# Patient Record
Sex: Female | Born: 1985 | Race: White | Hispanic: No | Marital: Married | Smoking: Current every day smoker
Health system: Southern US, Community
[De-identification: ages and names within clinical notes are randomized; demographics above are authoritative.]

## PROBLEM LIST (undated history)

## (undated) DIAGNOSIS — F32A Depression, unspecified: Secondary | ICD-10-CM

## (undated) DIAGNOSIS — D219 Benign neoplasm of connective and other soft tissue, unspecified: Secondary | ICD-10-CM

## (undated) DIAGNOSIS — K219 Gastro-esophageal reflux disease without esophagitis: Secondary | ICD-10-CM

## (undated) DIAGNOSIS — N809 Endometriosis, unspecified: Secondary | ICD-10-CM

## (undated) DIAGNOSIS — F419 Anxiety disorder, unspecified: Secondary | ICD-10-CM

## (undated) HISTORY — DX: Anxiety disorder, unspecified: F41.9

## (undated) HISTORY — DX: Gastro-esophageal reflux disease without esophagitis: K21.9

## (undated) HISTORY — DX: Depression, unspecified: F32.A

## (undated) HISTORY — PX: TUBAL LIGATION: SHX77

## (undated) HISTORY — DX: Benign neoplasm of connective and other soft tissue, unspecified: D21.9

---

## 2013-12-15 ENCOUNTER — Emergency Department: Payer: Self-pay | Admitting: Emergency Medicine

## 2014-05-30 ENCOUNTER — Emergency Department: Payer: Self-pay | Admitting: Student

## 2014-07-30 ENCOUNTER — Ambulatory Visit: Payer: Self-pay | Admitting: Obstetrics and Gynecology

## 2014-08-03 ENCOUNTER — Ambulatory Visit: Payer: Self-pay | Admitting: Obstetrics and Gynecology

## 2014-10-05 LAB — SURGICAL PATHOLOGY

## 2014-10-11 NOTE — Op Note (Signed)
PATIENT NAME:  Brittany Nicholson, Brittany Nicholson MR#:  [id number] DATE OF BIRTH:  May 16, 1986  DATE OF PROCEDURE:  08/03/2014  PREOPERATIVE DIAGNOSES: 1.  Multiparity, desiring permanent sterilization.  2.  Mirena intrauterine device in place, expired.   POSTOPERATIVE DIAGNOSES:  1.  Multiparity, desiring permanent sterilization.  2.  Mirena intrauterine device in place, expired.  3.  Suspected endometriosis.   PROCEDURE: Laparoscopic bilateral salpingectomy and excision and fulguration of endometriosis.   SURGEON: Hildred Laser, MD   ASSISTANT: Sharon Seller, MD   ANESTHESIA: General.   ESTIMATED BLOOD LOSS: Minimal.   OPERATIVE FLUIDS: 800 mL.   URINE OUTPUT: 300 mL.   COMPLICATIONS: None.   FINDINGS: On exam under anesthesia, the uterus was sounded to approximately 9 cm. IUD strings were visible from the cervical os. On laparoscopy there was normal-appearing uterus and bilateral tubes and ovaries. There were adhesions of the bowel to the abdominal wall and left pelvic sidewall. There were several powder burn and white lesions noted on the right ovarian fossa and posterior cul-de-sac.  SPECIMENS: Right and left fallopian tubes, also cul-de-sac and right ovarian fossa biopsies.  CONDITION: Stable.   DESCRIPTION OF PROCEDURE: The patient was taken to the operating room where she was placed under general anesthesia without difficulty. She was then prepped and draped in normal sterile fashion and placed in the dorsal lithotomy position in Fountain stirrups. She was then prepped and draped in normal sterile fashion. After this a straight catheterization was performed with return of 300 mL of clear urine. Next, a sterile speculum was placed inside the patient's vagina. The cervix was identified. Ring forceps were then used to grasp the strings of the IUD. The IUD was removed without difficulty. Next, the uterus was sounded to approximately 9 cm and a Hulka clamp was then placed for uterine  manipulation.  Attention was then turned to the abdomen where a 5 mm infraumbilical incision was made. Next, the 5 mm trocar and sheath were inserted into the patient's abdomen under direct visualization with the laparoscope. Once entrance into the abdominal cavity had been confirmed, the abdomen was insufflated with CO2 gas. A survey of the abdomen was performed with the above findings noted. The upper abdomen was surveyed and it appeared to be normal. Next, two 5 mm trocar and sheath were then inserted on the lateral aspects of the abdomen careful to avoid any vasculature under direct visualization. Next, the left fallopian tube was grasped and elevated. The Harmonic device was then inserted through one of the 5 mm trocars and the device was then used to transect the mesosalpinx inferior to the fallopian tube and then to transect the fallopian tube near the cornual region. This fallopian tube was then removed through the trocar site and sent to pathology. Attention was then turned to the right fallopian tube where in a similar fashion the fallopian tube was then transected using the Harmonic device and removed through the 5 mm trocar site. This was also sent to pathology for evaluation. The transected areas appeared to be hemostatic.   Next, attention was then turned to the areas of suspected endometriosis implants. These areas were then removed using a biopsy forceps, of the right ovarian fossa and the posterior cul-de-sac. The areas were then cauterized using the Kleppinger device. Again, hemostasis was noted throughout. Estimated blood loss was minimal. The patient tolerated the procedure well. The trocars were then removed from the abdomen after the CO2 gas was allowed to be expressed. Trocar sites remained hemostatic. The  incisions were approximated using LiquiBand. The Hulka clamp was then removed from the patient's vagina.  All sponge and instrument counts were correct x2 at the end of the procedure.  The patient was awakened and taken to the PACU in stable condition.   ____________________________ Jacques EarthlyAnika S. Valentino Saxonherry, MD asc:sb D: 08/04/2014 08:42:58 ET T: 08/04/2014 11:32:31 ET JOB#: [id number]  cc: Jacques EarthlyAnika S. Valentino Saxonherry, MD, <Dictator> Fabian NovemberANIKA S Zeph Riebel MD ELECTRONICALLY SIGNED 08/07/2014 14:52

## 2015-03-03 ENCOUNTER — Encounter: Payer: Self-pay | Admitting: Emergency Medicine

## 2015-03-03 ENCOUNTER — Emergency Department: Payer: Medicaid Other

## 2015-03-03 ENCOUNTER — Emergency Department
Admission: EM | Admit: 2015-03-03 | Discharge: 2015-03-03 | Disposition: A | Discharge status: Home / Self Care | Payer: Medicaid Other | Attending: Emergency Medicine | Admitting: Emergency Medicine

## 2015-03-03 DIAGNOSIS — I880 Nonspecific mesenteric lymphadenitis: Secondary | ICD-10-CM | POA: Diagnosis not present

## 2015-03-03 DIAGNOSIS — Z720 Tobacco use: Secondary | ICD-10-CM | POA: Insufficient documentation

## 2015-03-03 DIAGNOSIS — Z3202 Encounter for pregnancy test, result negative: Secondary | ICD-10-CM | POA: Insufficient documentation

## 2015-03-03 DIAGNOSIS — R197 Diarrhea, unspecified: Secondary | ICD-10-CM

## 2015-03-03 DIAGNOSIS — K561 Intussusception: Secondary | ICD-10-CM

## 2015-03-03 DIAGNOSIS — R1011 Right upper quadrant pain: Secondary | ICD-10-CM | POA: Diagnosis present

## 2015-03-03 HISTORY — DX: Endometriosis, unspecified: N80.9

## 2015-03-03 LAB — COMPREHENSIVE METABOLIC PANEL
ALT: 23 U/L (ref 14–54)
AST: 23 U/L (ref 15–41)
Albumin: 4.4 g/dL (ref 3.5–5.0)
Alkaline Phosphatase: 64 U/L (ref 38–126)
Anion gap: 9 (ref 5–15)
BILIRUBIN TOTAL: 0.6 mg/dL (ref 0.3–1.2)
BUN: 11 mg/dL (ref 6–20)
CALCIUM: 9.8 mg/dL (ref 8.9–10.3)
CO2: 25 mmol/L (ref 22–32)
Chloride: 104 mmol/L (ref 101–111)
Creatinine, Ser: 0.59 mg/dL (ref 0.44–1.00)
GFR calc Af Amer: >60 mL/min (ref >60)
Glucose, Bld: 92 mg/dL (ref 65–99)
POTASSIUM: 4 mmol/L (ref 3.5–5.1)
Sodium: 138 mmol/L (ref 135–145)
TOTAL PROTEIN: 7.8 g/dL (ref 6.5–8.1)

## 2015-03-03 LAB — URINALYSIS COMPLETE WITH MICROSCOPIC (ARMC ONLY)
BILIRUBIN URINE: NEGATIVE
Glucose, UA: NEGATIVE mg/dL
HGB URINE DIPSTICK: NEGATIVE
KETONES UR: NEGATIVE mg/dL
LEUKOCYTES UA: NEGATIVE
Nitrite: NEGATIVE
PH: 6 (ref 5.0–8.0)
Protein, ur: NEGATIVE mg/dL
Specific Gravity, Urine: 1.024 (ref 1.005–1.030)

## 2015-03-03 LAB — CBC
HEMATOCRIT: 42.4 % (ref 35.0–47.0)
Hemoglobin: 14.6 g/dL (ref 12.0–16.0)
MCH: 31.1 pg (ref 26.0–34.0)
MCHC: 34.4 g/dL (ref 32.0–36.0)
MCV: 90.6 fL (ref 80.0–100.0)
Platelets: 243 10*3/uL (ref 150–440)
RBC: 4.69 MIL/uL (ref 3.80–5.20)
RDW: 12.8 % (ref 11.5–14.5)
WBC: 15.5 10*3/uL — AB (ref 3.6–11.0)

## 2015-03-03 LAB — LIPASE, BLOOD: Lipase: 36 U/L (ref 22–51)

## 2015-03-03 MED ORDER — FENTANYL CITRATE (PF) 100 MCG/2ML IJ SOLN
50.0000 ug | Freq: Once | INTRAMUSCULAR | Status: AC
  Administered 2015-03-03: 50 ug via INTRAVENOUS
  Filled 2015-03-03: qty 2

## 2015-03-03 MED ORDER — OXYCODONE-ACETAMINOPHEN 5-325 MG PO TABS: 1.0000 | ORAL_TABLET | Freq: Four times a day (QID) | ORAL | Status: AC | PRN

## 2015-03-03 MED ORDER — ONDANSETRON HCL 4 MG PO TABS: 4.0000 mg | ORAL_TABLET | Freq: Three times a day (TID) | ORAL | Status: AC | PRN

## 2015-03-03 MED ORDER — IOHEXOL 300 MG/ML  SOLN
100.0000 mL | Freq: Once | INTRAMUSCULAR | Status: AC | PRN
  Administered 2015-03-03: 100 mL via INTRAVENOUS

## 2015-03-03 MED ORDER — LOPERAMIDE HCL 2 MG PO TABS: 2.0000 mg | ORAL_TABLET | Freq: Four times a day (QID) | ORAL | Status: DC | PRN

## 2015-03-03 MED ORDER — ONDANSETRON HCL 4 MG/2ML IJ SOLN
4.0000 mg | Freq: Once | INTRAMUSCULAR | Status: AC
  Administered 2015-03-03: 4 mg via INTRAVENOUS
  Filled 2015-03-03: qty 2

## 2015-03-03 MED ORDER — IOHEXOL 240 MG/ML SOLN
25.0000 mL | Freq: Once | INTRAMUSCULAR | Status: AC | PRN
  Administered 2015-03-03: 25 mL via ORAL

## 2015-03-03 NOTE — ED Notes (Signed)
Patient to ER for c/o RUQ pain since Sunday morning. Patient states she had large steak dinner on Saturday night, woke up in the middle of the night with severe pain. Patient states pain is worse at night and after eating. Also reports intermittent diarrhea.

## 2015-03-03 NOTE — Consult Note (Signed)
Patient ID: Brittany Nicholson, female   DOB: 1985/10/08, 29 y.o.   MRN: [id number] CC: ABDOMINAL PAIN HPI Brittany Nicholson is a 29 y.o. female who presents to emergency department with a three-day history of abdominal pain. Patient states that during this time. She's had progressively worsening waves of right upper quadrant to the right lateral abdominal pain. During this time. She initially constipation prior to today where she had multiple bouts of loose watery stools. She denies any fevers, chills, nausea, vomiting however she has had a difficult to describe full sensation. Her only sick contacts have been her children who are both battling head colds. She is otherwise in her usual state of good health  HPI  Past Medical History  Diagnosis Date  . Endometriosis     Past Surgical History  Procedure Laterality Date  . Tubal ligation      No family history on file.  Social History Social History  Substance Use Topics  . Smoking status: Current Every Day Smoker  . Smokeless tobacco: None  . Alcohol Use: Yes    Allergies  Allergen Reactions  . Aspirin Swelling  . Ibuprofen Swelling  . Nsaids Swelling    No current facility-administered medications for this encounter.   Current Outpatient Prescriptions  Medication Sig Dispense Refill  . loperamide (LOPERAMIDE A-D) 2 MG tablet Take 1 tablet (2 mg total) by mouth 4 (four) times daily as needed for diarrhea or loose stools. 12 tablet 0  . ondansetron (ZOFRAN) 4 MG tablet Take 1 tablet (4 mg total) by mouth every 8 (eight) hours as needed for nausea or vomiting. 15 tablet 1  . oxyCODONE-acetaminophen (ROXICET) 5-325 MG per tablet Take 1 tablet by mouth every 6 (six) hours as needed for severe pain. 15 tablet 0     Review of Systems A multipoint review of systems was completed. All pertinent positives and negatives within the history of present illness.  Physical Exam Blood pressure 122/83, pulse 88, temperature 98.3 F (36.8 C),  temperature source Oral, resp. rate 16, height  (1.575 m), weight 89.812 kg (198 lb), last menstrual period 02/24/2015, SpO2 98 %. CONSTITUTIONAL: No acute distress. EYES: Pupils are equal, round, and reactive to light, Sclera are non-icteric. EARS, NOSE, MOUTH AND THROAT: The oropharynx is clear. The oral mucosa is pink and moist. Hearing is intact to voice. LYMPH NODES:  Lymph nodes in the neck are normal. RESPIRATORY:  Lungs are clear. There is normal respiratory effort, with equal breath sounds bilaterally, and without pathologic use of accessory muscles. CARDIOVASCULAR: Heart is regular without murmurs, gallops, or rubs. GI: The abdomen is  soft, minimal tenderness to deep palpation to RUQ, and nondistended. There are no palpable masses. There is no hepatosplenomegaly. There are normal bowel sounds in all quadrants. GU: Rectal deferred.   MUSCULOSKELETAL: Normal muscle strength and tone. No cyanosis or edema.   SKIN: Turgor is good and there are no pathologic skin lesions or ulcers. NEUROLOGIC: Motor and sensation is grossly normal. Cranial nerves are grossly intact. PSYCH:  Oriented to person, place and time. Affect is normal.  Data Reviewed I reviewed her labs and images. Her only lab abnormalities leukocytosis of 15,000. Right upper quadrant ultrasound was within normal limits her CT scan showed a short segment nonobstructing small bowel intussusception. He notices nonobstructing as contrast passes through it in his proximal and distal to the intussusception. There is also multiple lymph nodes in the right lower quadrant along the bowel mesentery. I have personally reviewed the  patient's imaging, laboratory findings and medical records.    Assessment    29 year old female with mesenteric adenitis.    Plan    Discussed with the patient that her signs and symptoms are most consistent with mesenteric adenitis. This is likely related to a viral gastroenteritis. This would correlate  with her children currently having a viral URI. Discussed that this is not a surgical disease. It is usually self-limiting and should resolve on its within 7-10 days. In terms of her intussusception that was visualized on CT scan. Discussed that should she develop any signs or symptoms of obstruction which would include nausea, vomiting, bowel/abdominal distention that she is to return to the emergency department. Otherwise she can continue as an outpatient and follow up with her PCM.     Time spent with the patient was 20 minutes, with more than 50% of the time spent in face-to-face education, counseling and care coordination.     Ricarda Frame 03/03/2015, 5:26 PM

## 2015-03-03 NOTE — ED Notes (Signed)
POC preg negative.

## 2015-03-03 NOTE — Discharge Instructions (Signed)
Abdominal Pain Many things can cause abdominal pain. Usually, abdominal pain is not caused by a disease and will improve without treatment. It can often be observed and treated at home. Your health care provider will do a physical exam and possibly order blood tests and X-rays to help determine the seriousness of your pain. However, in many cases, more time must pass before a clear cause of the pain can be found. Before that point, your health care provider may not know if you need more testing or further treatment. HOME CARE INSTRUCTIONS  Monitor your abdominal pain for any changes. The following actions may help to alleviate any discomfort you are experiencing:  Only take over-the-counter or prescription medicines as directed by your health care provider.  Do not take laxatives unless directed to do so by your health care provider.  Try a clear liquid diet (broth, tea, or water) as directed by your health care provider. Slowly move to a bland diet as tolerated. SEEK MEDICAL CARE IF:  You have unexplained abdominal pain.  You have abdominal pain associated with nausea or diarrhea.  You have pain when you urinate or have a bowel movement.  You experience abdominal pain that wakes you in the night.  You have abdominal pain that is worsened or improved by eating food.  You have abdominal pain that is worsened with eating fatty foods.  You have a fever. SEEK IMMEDIATE MEDICAL CARE IF:   Your pain does not go away within 2 hours.  You keep throwing up (vomiting).  Your pain is felt only in portions of the abdomen, such as the right side or the left lower portion of the abdomen.  You pass bloody or black tarry stools. MAKE SURE YOU:  Understand these instructions.   Will watch your condition.   Will get help right away if you are not doing well or get worse.  Document Released: 03/08/2005 Document Revised: 06/03/2013 Document Reviewed: 02/05/2013 [facility] Patient Information  2015 [place], [place]. This information is not intended to replace advice given to you by your health care provider. Make sure you discuss any questions you have with your health care provider.   Drink plenty of fluids to stay well hydrated. You may follow up BRAT diet to help with diarrhea.  You may take Tylenol or Motrin for mild to moderate pain, Percocet for severe pain. Do not drive within 8 hours of taking Percocet and do not care for your children alone when taking Percocet  Please return to the emergency department for severe pain, inability to keep down fluids, fever, or any other symptoms concerning to you.

## 2015-03-03 NOTE — ED Notes (Signed)
Patient transported to Ultrasound 

## 2015-03-03 NOTE — ED Provider Notes (Addendum)
-----------------------------------------   4:45 PM on 03/03/2015 -----------------------------------------  I have taken signout from Dr. Derrill Kay about this patient.29 y.o. nonpregnant female with right-sided abdominal pain to in the ED is afebrile, and has negative right upper quadrant ultrasound. She did have a white count of 15 and CT scan was ordered. The CT abdomen shows a small segment of small bowel intussusception without obstruction. I have talked to Dr. Tonita Cong from general surgery who will, and evaluate the patient. At this time the patient's pain is well-controlled.  ----------------------------------------- 5:16 PM on 03/03/2015 -----------------------------------------  Patient has been seen and evaluated by Dr. Tonita Cong. He feels that the etiology of her CT scan findings are likely viral gastroenteritis. We will plan to treat her for her diarrhea and pain and discharged home. She has been given return precautions and red flag symptoms to come back for. She understands the plan and will follow up with her primary care physician. Per Dr. Lovett Sox, antibiotics are not indicated at this time.  Rockne Menghini, MD 03/03/15 1646  Rockne Menghini, MD 03/03/15 [phone]

## 2015-03-03 NOTE — ED Provider Notes (Signed)
[facility] Emergency Department Toba Claudio Note    ____________________________________________  Time seen: 1400  I have reviewed the triage vital signs and the nursing notes.   HISTORY  Chief Complaint Abdominal Pain   History limited by: Not Limited   HPI Brittany Nicholson is a 29 y.o. female who presents to the emergency department today with concerns for right-sided and right upper quadrant abdominal pain. The patient states that these symptoms started 4 days ago after a large meal. She states that the pain has been constant and has gradually gotten worse. She states the pain is worse after eating. She has not had any vomiting. She has had some diarrhea but denies any bloody stool. Denies any fevers. Denies any chest pain or shortness breath.   Past Medical History  Diagnosis Date  . Endometriosis     There are no active problems to display for this patient.   Past Surgical History  Procedure Laterality Date  . Tubal ligation      No current outpatient prescriptions on file.  Allergies Aspirin; Ibuprofen; and Nsaids  No family history on file.  Social History Social History  Substance Use Topics  . Smoking status: Current Every Day Smoker  . Smokeless tobacco: None  . Alcohol Use: Yes    Review of Systems  Constitutional: Negative for fever. Cardiovascular: Negative for chest pain. Respiratory: Negative for shortness of breath. Gastrointestinal: Positive for abdominal pain Genitourinary: Negative for dysuria. Musculoskeletal: Negative for back pain. Skin: Negative for rash. Neurological: Negative for headaches, focal weakness or numbness.   10-point ROS otherwise negative.  ____________________________________________   PHYSICAL EXAM:  VITAL SIGNS: ED Triage Vitals  Enc Vitals Group     BP 03/03/15 1130 128/80 mmHg     Pulse Rate 03/03/15 1130 96     Resp 03/03/15 1130 18     Temp 03/03/15 1130 98.3 F (36.8 C)      Temp Source 03/03/15 1130 Oral     SpO2 03/03/15 1130 99 %     Weight 03/03/15 1130 198 lb (89.812 kg)     Height 03/03/15 1130  (1.575 m)     Head Cir --      Peak Flow --      Pain Score 03/03/15 1135 4   Constitutional: Alert and oriented. Well appearing and in no distress. Eyes: Conjunctivae are normal. PERRL. Normal extraocular movements. ENT   Head: Normocephalic and atraumatic.   Nose: No congestion/rhinnorhea.   Mouth/Throat: Mucous membranes are moist.   Neck: No stridor. Hematological/Lymphatic/Immunilogical: No cervical lymphadenopathy. Cardiovascular: Normal rate, regular rhythm.  No murmurs, rubs, or gallops. Respiratory: Normal respiratory effort without tachypnea nor retractions. Breath sounds are clear and equal bilaterally. No wheezes/rales/rhonchi. Gastrointestinal: Soft. Tender to palpation in the right side of her abdomen. No rebound. No guarding. No distention. There is no CVA tenderness. Genitourinary: Deferred Musculoskeletal: Normal range of motion in all extremities. No joint effusions.  No lower extremity tenderness nor edema. Neurologic:  Normal speech and language. No gross focal neurologic deficits are appreciated. Speech is normal.  Skin:  Skin is warm, dry and intact. No rash noted. Psychiatric: Mood and affect are normal. Speech and behavior are normal. Patient exhibits appropriate insight and judgment.  ____________________________________________    LABS (pertinent positives/negatives)  Labs Reviewed  CBC - Abnormal; Notable for the following:    WBC 15.5 (*)    All other components within normal limits  URINALYSIS COMPLETEWITH MICROSCOPIC ([facility] ONLY) - Abnormal; Notable for  the following:    Color, Urine YELLOW (*)    APPearance CLEAR (*)    Bacteria, UA RARE (*)    Squamous Epithelial / LPF 0-5 (*)    All other components within normal limits  LIPASE, BLOOD  COMPREHENSIVE METABOLIC PANEL  POC URINE PREG, ED      ____________________________________________   EKG  None  ____________________________________________    RADIOLOGY  Ultrasound right upper quadrant IMPRESSION: 5 mm adherent sludge ball or polyp. No other evidence for acute Cholecystitis.  CT abdomen and pelvis pending at time of sign out ____________________________________________   PROCEDURES  Procedure(s) performed: None  Critical Care performed: No  ____________________________________________   INITIAL IMPRESSION / ASSESSMENT AND PLAN / ED COURSE  Pertinent labs & imaging results that were available during my care of the patient were reviewed by me and considered in my medical decision making (see chart for details).  Patient presented to the emergency department today with right-sided and right upper quadrant abdominal pain. She did state is worse with eating. The initial thought was that this was likely gallstone. An ultrasound was performed, which did not show any signs of cholecystitis. Given that patient does have pain in the right side and does have an elevated white blood cell count and her serum I do think appendicitis now has to be ruled out. Will obtain a CT abdomen and pelvis.  ____________________________________________   FINAL CLINICAL IMPRESSION(S) / ED DIAGNOSES  Final diagnoses:  RUQ pain     Phineas Semen, MD 03/03/15 1600

## 2018-06-29 ENCOUNTER — Encounter: Payer: Self-pay | Admitting: Emergency Medicine

## 2018-06-29 ENCOUNTER — Emergency Department
Admission: EM | Admit: 2018-06-29 | Discharge: 2018-06-29 | Disposition: A | Discharge status: Home / Self Care | Payer: Managed Care, Other (non HMO) | Attending: Emergency Medicine | Admitting: Emergency Medicine

## 2018-06-29 ENCOUNTER — Other Ambulatory Visit: Payer: Self-pay

## 2018-06-29 DIAGNOSIS — F17200 Nicotine dependence, unspecified, uncomplicated: Secondary | ICD-10-CM | POA: Insufficient documentation

## 2018-06-29 DIAGNOSIS — R69 Illness, unspecified: Secondary | ICD-10-CM

## 2018-06-29 DIAGNOSIS — Z20828 Contact with and (suspected) exposure to other viral communicable diseases: Secondary | ICD-10-CM | POA: Diagnosis not present

## 2018-06-29 DIAGNOSIS — R05 Cough: Secondary | ICD-10-CM | POA: Diagnosis present

## 2018-06-29 DIAGNOSIS — J111 Influenza due to unidentified influenza virus with other respiratory manifestations: Secondary | ICD-10-CM | POA: Insufficient documentation

## 2018-06-29 MED ORDER — OSELTAMIVIR PHOSPHATE 75 MG PO CAPS: 75.0000 mg | ORAL_CAPSULE | Freq: Two times a day (BID) | ORAL | 0 refills | Status: AC

## 2018-06-29 MED ORDER — PROMETHAZINE-DM 6.25-15 MG/5ML PO SYRP: 5.0000 mL | ORAL_SOLUTION | Freq: Four times a day (QID) | ORAL | 0 refills | Status: DC | PRN

## 2018-06-29 NOTE — Discharge Instructions (Addendum)
Advised extra strength Tylenol for fever and body aches.

## 2018-06-29 NOTE — ED Provider Notes (Signed)
[facility] Emergency Department Provider Note   ____________________________________________   First MD Initiated Contact with Patient 06/29/18 1206     (approximate)  I have reviewed the triage vital signs and the nursing notes.   HISTORY  Chief Complaint Cough and Generalized Body Aches    HPI Brittany Nicholson is a 33 y.o. female patient presents with acute onset of body aches  ,cough, sore throat, and nausea and vomiting.  Patient states daughter was diagnosed with influenza A 4 days ago.  Patient rates her pain discomfort a 10/10.  Patient described the pain as "achy".  No palliative measure for complaint.  Past Medical History:  Diagnosis Date  . Endometriosis     Patient Active Problem List   Diagnosis Date Noted  . Mesenteric adenitis     Past Surgical History:  Procedure Laterality Date  . TUBAL LIGATION      Prior to Admission medications   Medication Sig Start Date End Date Taking? Authorizing Provider  loperamide (LOPERAMIDE A-D) 2 MG tablet Take 1 tablet (2 mg total) by mouth 4 (four) times daily as needed for diarrhea or loose stools. 03/03/15   Rockne Menghini, MD  oseltamivir (TAMIFLU) 75 MG capsule Take 1 capsule (75 mg total) by mouth 2 (two) times daily for 5 days. 06/29/18 07/04/18  Joni Reining, PA-C  promethazine-dextromethorphan (PROMETHAZINE-DM) 6.25-15 MG/5ML syrup Take 5 mLs by mouth 4 (four) times daily as needed for cough. 06/29/18   Joni Reining, PA-C    Allergies Aspirin; Ibuprofen; and Nsaids  No family history on file.  Social History Social History   Tobacco Use  . Smoking status: Current Every Day Smoker  Substance Use Topics  . Alcohol use: Yes  . Drug use: Not on file    Review of Systems  Constitutional: Chills and body aches. Eyes: No visual changes. ENT: Nasal congestion and sore throat. Cardiovascular: Denies chest pain. Respiratory: Denies shortness of breath.  Nonproductive  cough. Gastrointestinal: No abdominal pain.  No nausea, no vomiting.  No diarrhea.  No constipation. Genitourinary: Negative for dysuria. Musculoskeletal: Negative for back pain. Skin: Negative for rash. Neurological: Positive for headaches, but denies focal weakness or numbness. Allergic/Immunilogical: Aspirin and NSAIDs. ____________________________________________   PHYSICAL EXAM:  VITAL SIGNS: ED Triage Vitals  Enc Vitals Group     BP 06/29/18 1148 (!) 145/69     Pulse Rate 06/29/18 1148 99     Resp 06/29/18 1148 20     Temp 06/29/18 1148 98.5 F (36.9 C)     Temp Source 06/29/18 1148 Oral     SpO2 06/29/18 1148 98 %     Weight 06/29/18 1150 220 lb (99.8 kg)     Height 06/29/18 1150 5\' 2"  (1.575 m)     Head Circumference --      Peak Flow --      Pain Score 06/29/18 1150 10     Pain Loc --      Pain Edu? --      Excl. in GC? --    Constitutional: Alert and oriented. Well appearing and in no acute distress. Eyes: Conjunctivae are normal. PERRL. EOMI. Head: Atraumatic. Nose: Edematous nasal turbinates clear rhinorrhea. Mouth/Throat: Mucous membranes are moist.  Oropharynx non-erythematous.  Postnasal drainage. Neck: No stridor. Hematological/Lymphatic/Immunilogical: No cervical lymphadenopathy. Cardiovascular: Normal rate, regular rhythm. Grossly normal heart sounds.  Good peripheral circulation. Respiratory: Normal respiratory effort.  No retractions. Lungs CTAB. Gastrointestinal: Soft and nontender. No distention. No abdominal bruits.  No CVA tenderness. Neurologic:  Normal speech and language. No gross focal neurologic deficits are appreciated. No gait instability. Skin:  Skin is warm, dry and intact. No rash noted. Psychiatric: Mood and affect are normal. Speech and behavior are normal.  ____________________________________________   LABS (all labs ordered are listed, but only abnormal results are displayed)  Labs Reviewed - No data to  display ____________________________________________  EKG   ____________________________________________  RADIOLOGY  ED MD interpretation:    Official radiology report(s): No results found.  ____________________________________________   PROCEDURES  Procedure(s) performed: None  Procedures  Critical Care performed: No  ____________________________________________   INITIAL IMPRESSION / ASSESSMENT AND PLAN / ED COURSE  As part of my medical decision making, I reviewed the following data within the electronic MEDICAL RECORD NUMBER    Patient presents with cough and body aches status post exposure to flu by her daughter.  Patient given discharge care instruction and prescription for Tamiflu and Bromfed-DM.  Due to her allergies to NSAIDs advised exercise Tylenol for body aches and pains.  Patient given a work note.  Patient advised [facility]-up [facility] if no improvement in 3 to 5 days.  Return right ED if condition worsens.      ____________________________________________   FINAL CLINICAL IMPRESSION(S) / ED DIAGNOSES  Final diagnoses:  Influenza-like illness     ED Discharge Orders         Ordered    oseltamivir (TAMIFLU) 75 MG capsule  2 times daily     06/29/18 1212    promethazine-dextromethorphan (PROMETHAZINE-DM) 6.25-15 MG/5ML syrup  4 times daily PRN     06/29/18 1212           Note:  This document was prepared using Dragon voice recognition software and may include unintentional dictation errors.    Joni ReiningSmith, Ronald K, PA-C 06/29/18 1217    Schaevitz, Myra Rudeavid Matthew, MD 06/29/18 [phone]

## 2018-06-29 NOTE — ED Notes (Signed)
Pt verbalized understanding of discharge instructions. NAD at this time. 

## 2018-06-29 NOTE — ED Notes (Signed)
See triage note  Presents with body aches and subjective fever  Afebrile on arrival   Occasional cough   Daughter dx'd with flu last week

## 2018-06-29 NOTE — ED Triage Notes (Signed)
Cough and body aches since yesterday. Daughter tested positive for flu this week.

## 2019-06-24 ENCOUNTER — Encounter: Payer: Self-pay | Admitting: Emergency Medicine

## 2019-06-24 ENCOUNTER — Other Ambulatory Visit: Payer: Self-pay

## 2019-06-24 ENCOUNTER — Emergency Department
Admission: EM | Admit: 2019-06-24 | Discharge: 2019-06-24 | Disposition: A | Discharge status: Home / Self Care | Payer: Managed Care, Other (non HMO) | Attending: Emergency Medicine | Admitting: Emergency Medicine

## 2019-06-24 DIAGNOSIS — Z79899 Other long term (current) drug therapy: Secondary | ICD-10-CM | POA: Insufficient documentation

## 2019-06-24 DIAGNOSIS — R109 Unspecified abdominal pain: Secondary | ICD-10-CM | POA: Insufficient documentation

## 2019-06-24 DIAGNOSIS — F17210 Nicotine dependence, cigarettes, uncomplicated: Secondary | ICD-10-CM | POA: Insufficient documentation

## 2019-06-24 LAB — URINALYSIS, COMPLETE (UACMP) WITH MICROSCOPIC
Bilirubin Urine: NEGATIVE
Glucose, UA: NEGATIVE mg/dL
Ketones, ur: 20 mg/dL — AB
Leukocytes,Ua: NEGATIVE
Nitrite: NEGATIVE
Protein, ur: NEGATIVE mg/dL
Specific Gravity, Urine: 1.011 (ref 1.005–1.030)
pH: 6 (ref 5.0–8.0)

## 2019-06-24 LAB — POCT PREGNANCY, URINE: Preg Test, Ur: NEGATIVE

## 2019-06-24 MED ORDER — TRAMADOL HCL 50 MG PO TABS: 50.0000 mg | ORAL_TABLET | Freq: Four times a day (QID) | ORAL | 0 refills | Status: AC | PRN

## 2019-06-24 MED ORDER — LIDOCAINE 5 % EX PTCH
1.0000 | MEDICATED_PATCH | CUTANEOUS | Status: DC
  Administered 2019-06-24: 13:00:00 1 via TRANSDERMAL
  Filled 2019-06-24: qty 1

## 2019-06-24 MED ORDER — CYCLOBENZAPRINE HCL 10 MG PO TABS: 10.0000 mg | ORAL_TABLET | Freq: Three times a day (TID) | ORAL | 0 refills | Status: DC | PRN

## 2019-06-24 NOTE — ED Provider Notes (Signed)
[facility] Emergency Department Provider Note   ____________________________________________   First MD Initiated Contact with Patient 06/24/19 1218     (approximate)  I have reviewed the triage vital signs and the nursing notes.   HISTORY  Chief Complaint Back Pain    HPI Brittany Nicholson is a 34 y.o. female patient complain of left flank pain for 2 days.  Patient states she awakened yesterday morning with pain which continued until today.  Patient state was able to go to work yesterday but when she came home the pain was more intense.  Patient this morning the pain worsens.  Patient states she felt like she "pulled a muscle ".  Patient cannot remember any exact provocative incident 2 days ago.  No radicular component to the pain.  No bladder or bowel dysfunction.  Patient rates pain at a 6/10.  Patient describes the pain as "tight".  No palliative measure for complaint.         Past Medical History:  Diagnosis Date  . Endometriosis     Patient Active Problem List   Diagnosis Date Noted  . Mesenteric adenitis     Past Surgical History:  Procedure Laterality Date  . TUBAL LIGATION      Prior to Admission medications   Medication Sig Start Date End Date Taking? Authorizing Provider  cyclobenzaprine (FLEXERIL) 10 MG tablet Take 1 tablet (10 mg total) by mouth 3 (three) times daily as needed. 06/24/19   Joni Reining, PA-C  loperamide (LOPERAMIDE A-D) 2 MG tablet Take 1 tablet (2 mg total) by mouth 4 (four) times daily as needed for diarrhea or loose stools. 03/03/15   Rockne Menghini, MD  promethazine-dextromethorphan (PROMETHAZINE-DM) 6.25-15 MG/5ML syrup Take 5 mLs by mouth 4 (four) times daily as needed for cough. 06/29/18   Joni Reining, PA-C  traMADol (ULTRAM) 50 MG tablet Take 1 tablet (50 mg total) by mouth every 6 (six) hours as needed. 06/24/19 06/23/20  Joni Reining, PA-C    Allergies Aspirin, Ibuprofen, and Nsaids  No family  history on file.  Social History Social History   Tobacco Use  . Smoking status: Current Every Day Smoker  Substance Use Topics  . Alcohol use: Yes  . Drug use: Not on file    Review of Systems Constitutional: No fever/chills Eyes: No visual changes. ENT: No sore throat. Cardiovascular: Denies chest pain. Respiratory: Denies shortness of breath. Gastrointestinal: No abdominal pain.  No nausea, no vomiting.  No diarrhea.  No constipation. Genitourinary: Negative for dysuria. Musculoskeletal: Positive for back pain. Skin: Negative for rash. Neurological: Negative for headaches, focal weakness or numbness. Allergic/Immunilogical: NSAIDs  ____________________________________________   PHYSICAL EXAM:  VITAL SIGNS: ED Triage Vitals [06/24/19 1137]  Enc Vitals Group     BP      Pulse      Resp      Temp      Temp src      SpO2      Weight 250 lb (113.4 kg)     Height 5\' 2"  (1.575 m)     Head Circumference      Peak Flow      Pain Score 6     Pain Loc      Pain Edu?      Excl. in GC?    Constitutional: Alert and oriented. Well appearing and in no acute distress. Eyes: Conjunctivae are normal. PERRL. EOMI. Head: Atraumatic. Nose: No congestion/rhinnorhea. Mouth/Throat: Mucous membranes are moist.  Oropharynx non-erythematous. Neck: No stridor. No cervical spine tenderness to palpation. Hematological/Lymphatic/Immunilogical: No cervical lymphadenopathy. Cardiovascular: Normal rate, regular rhythm. Grossly normal heart sounds.  Good peripheral circulation. Respiratory: Normal respiratory effort.  No retractions. Lungs CTAB. Gastrointestinal: Soft and nontender. No distention. No abdominal bruits. No CVA tenderness. Musculoskeletal: No obvious spinal deformity.  Patient is nontender to palpation.  Patient has moderate guarding palpation of the left CVA area.   Neurologic:  Normal speech and language. No gross focal neurologic deficits are appreciated. No gait  instability. Skin:  Skin is warm, dry and intact. No rash noted. Psychiatric: Mood and affect are normal. Speech and behavior are normal.  ____________________________________________   LABS (all labs ordered are listed, but only abnormal results are displayed)  Labs Reviewed  URINALYSIS, COMPLETE (UACMP) WITH MICROSCOPIC - Abnormal; Notable for the following components:      Result Value   Color, Urine YELLOW (*)    APPearance HAZY (*)    Hgb urine dipstick SMALL (*)    Ketones, ur 20 (*)    Bacteria, UA RARE (*)    All other components within normal limits  POCT PREGNANCY, URINE  POC URINE PREG, ED   ____________________________________________  EKG   ____________________________________________  RADIOLOGY  ED MD interpretation:    Official radiology report(s): No results found.  ____________________________________________   PROCEDURES  Procedure(s) performed (including Critical Care):  Procedures   ____________________________________________   INITIAL IMPRESSION / ASSESSMENT AND PLAN / ED COURSE  As part of my medical decision making, I reviewed the following data within the [facility]     Patient presents with left flank pain for 2 days.  Discussed lab results with patient.  Physical exam consistent with muscle skeletal pain.  Patient given discharge care instruction work note.  Patient vies take medication as directed.  Patient advised establish care with [facility].    Brittany Nicholson was evaluated in Emergency Department on 06/24/2019 for the symptoms described in the history of present illness. She was evaluated in the context of the global COVID-19 pandemic, which necessitated consideration that the patient might be at risk for infection with the SARS-CoV-2 virus that causes COVID-19. Institutional protocols and algorithms that pertain to the evaluation of patients at risk for COVID-19 are in a state of rapid change based on  information released by regulatory bodies including the CDC and federal and state organizations. These policies and algorithms were followed during the patient's care in the ED.       ____________________________________________   FINAL CLINICAL IMPRESSION(S) / ED DIAGNOSES  Final diagnoses:  Left flank pain     ED Discharge Orders         Ordered    cyclobenzaprine (FLEXERIL) 10 MG tablet  3 times daily PRN     06/24/19 1341    traMADol (ULTRAM) 50 MG tablet  Every 6 hours PRN     06/24/19 1341           Note:  This document was prepared using Dragon voice recognition software and may include unintentional dictation errors.    Sable Feil, PA-C 06/24/19 1344    Carrie Mew, MD 06/25/19 2051

## 2019-06-24 NOTE — Discharge Instructions (Signed)
Follow discharge care instruction take medication as directed. °

## 2019-06-24 NOTE — ED Triage Notes (Signed)
Pt reports lower back pain when she bends, twists or lifts things since last pm. Pt reports it feels like a pulled muscle but she cannot recall any injures.

## 2019-06-24 NOTE — ED Notes (Signed)
Says left side back pain.  No pain with breating, but increase with movement.  No known injury.

## 2019-11-04 ENCOUNTER — Ambulatory Visit: Payer: Self-pay

## 2019-11-04 DIAGNOSIS — Z23 Encounter for immunization: Secondary | ICD-10-CM

## 2019-11-04 NOTE — Progress Notes (Signed)
   Covid-19 Vaccination Clinic  Name:  Brittany Nicholson    MRN: [id number] DOB: 02/29/1986  11/04/2019  [name] was observed post Covid-19 immunization for 15 minutes without incident. She was provided with Vaccine Information Sheet and instruction to access the V-Safe system.   [name] was instructed to call 911 with any severe reactions post vaccine: Marland Kitchen Difficulty breathing  . Swelling of face and throat  . A fast heartbeat  . A bad rash all over body  . Dizziness and weakness   Immunizations Administered    Name Date Dose VIS Date Route   Pfizer COVID-19 Vaccine 11/04/2019  6:14 PM 0.3 mL 08/06/2018 Intramuscular   Manufacturer: [employer], [employer]   Lot: [id number]   NDC: [address]-0

## 2019-11-25 ENCOUNTER — Ambulatory Visit: Payer: Medicaid Other | Attending: Internal Medicine

## 2019-11-25 DIAGNOSIS — Z23 Encounter for immunization: Secondary | ICD-10-CM

## 2019-11-25 NOTE — Progress Notes (Signed)
   Covid-19 Vaccination Clinic  Name:  Brittany Nicholson    MRN: [id number] DOB: Dec 24, 1985  11/25/2019  [name] was observed post Covid-19 immunization for 15 minutes without incident. She was provided with Vaccine Information Sheet and instruction to access the V-Safe system.   [name] was instructed to call 911 with any severe reactions post vaccine: Marland Kitchen Difficulty breathing  . Swelling of face and throat  . A fast heartbeat  . A bad rash all over body  . Dizziness and weakness   Immunizations Administered    Name Date Dose VIS Date Route   Pfizer COVID-19 Vaccine 11/25/2019  4:57 PM 0.3 mL 08/06/2018 Intramuscular   Manufacturer: [employer], [employer]   Lot: [id number]   NDC: [address]-4

## 2020-12-09 ENCOUNTER — Other Ambulatory Visit: Payer: Self-pay

## 2020-12-09 ENCOUNTER — Encounter: Payer: Self-pay | Admitting: Emergency Medicine

## 2020-12-09 ENCOUNTER — Emergency Department
Admission: EM | Admit: 2020-12-09 | Discharge: 2020-12-09 | Disposition: A | Discharge status: Home / Self Care | Payer: No Typology Code available for payment source | Attending: Emergency Medicine | Admitting: Emergency Medicine

## 2020-12-09 ENCOUNTER — Emergency Department: Payer: No Typology Code available for payment source

## 2020-12-09 DIAGNOSIS — F17200 Nicotine dependence, unspecified, uncomplicated: Secondary | ICD-10-CM | POA: Diagnosis not present

## 2020-12-09 DIAGNOSIS — M25511 Pain in right shoulder: Secondary | ICD-10-CM | POA: Diagnosis not present

## 2020-12-09 DIAGNOSIS — Y990 Civilian activity done for income or pay: Secondary | ICD-10-CM | POA: Diagnosis not present

## 2020-12-09 DIAGNOSIS — X500XXA Overexertion from strenuous movement or load, initial encounter: Secondary | ICD-10-CM | POA: Insufficient documentation

## 2020-12-09 LAB — POC URINE PREG, ED: Preg Test, Ur: NEGATIVE

## 2020-12-09 MED ORDER — HYDROCODONE-ACETAMINOPHEN 5-325 MG PO TABS
1.0000 | ORAL_TABLET | Freq: Once | ORAL | Status: AC
  Administered 2020-12-09: 1 via ORAL
  Filled 2020-12-09: qty 1

## 2020-12-09 MED ORDER — HYDROCODONE-ACETAMINOPHEN 5-325 MG PO TABS: 1.0000 | ORAL_TABLET | Freq: Four times a day (QID) | ORAL | 0 refills | Status: AC | PRN

## 2020-12-09 NOTE — ED Provider Notes (Signed)
[facility] Emergency Department Provider Note  ____________________________________________   Event Date/Time   First MD Initiated Contact with Patient 12/09/20 [phone]     (approximate)  I have reviewed the triage vital signs and the nursing notes.   HISTORY  Chief Complaint Shoulder Pain    HPI Brittany Nicholson is a 35 y.o. female presents to the ED with complaint of right shoulder pain.  Patient states that she was at work this morning and approximately 6 AM lifted a large mixing bowl to go into the dishwasher when she felt a pop in her shoulder.  Patient states that she lifted and twisted at the same time.  She denies any prior injury to her right shoulder.  She is unable to move secondary to pain.  No over-the-counter medications been taken at this time.  She rates her pain as a 5 out of 10.       Past Medical History:  Diagnosis Date   Endometriosis     Patient Active Problem List   Diagnosis Date Noted   Mesenteric adenitis     Past Surgical History:  Procedure Laterality Date   TUBAL LIGATION      Prior to Admission medications   Medication Sig Start Date End Date Taking? Authorizing Provider  HYDROcodone-acetaminophen (NORCO/VICODIN) 5-325 MG tablet Take 1 tablet by mouth every 6 (six) hours as needed for moderate pain. 12/09/20 12/09/21 Yes Santonio Speakman L, PA-C    Allergies Aspirin, Ibuprofen, and Nsaids  History reviewed. No pertinent family history.  Social History Social History   Tobacco Use   Smoking status: Every Day    Pack years: 0.00  Substance Use Topics   Alcohol use: Yes    Review of Systems Constitutional: No fever/chills Eyes: No visual changes. Cardiovascular: Denies chest pain. Respiratory: Denies shortness of breath. Gastrointestinal:   No nausea, no vomiting. Musculoskeletal: Positive for right shoulder pain. Skin: Negative for rash. Neurological: Negative for headaches, focal weakness or  numbness. ____________________________________________   PHYSICAL EXAM:  VITAL SIGNS: ED Triage Vitals  Enc Vitals Group     BP 12/09/20 0800 (!) 141/79     Pulse Rate 12/09/20 0800 (!) 104     Resp 12/09/20 0800 17     Temp 12/09/20 0800 98.4 F (36.9 C)     Temp Source 12/09/20 0800 Oral     SpO2 12/09/20 0800 97 %     Weight 12/09/20 0759 230 lb (104.3 kg)     Height 12/09/20 0759 5\' 2"  (1.575 m)     Head Circumference --      Peak Flow --      Pain Score 12/09/20 0759 5     Pain Loc --      Pain Edu? --      Excl. in GC? --     Constitutional: Alert and oriented. Well appearing and in no acute distress. Eyes: Conjunctivae are normal.  Head: Atraumatic. Neck: No stridor.   Cardiovascular: Normal rate, regular rhythm. Grossly normal heart sounds.  Good peripheral circulation. Respiratory: Normal respiratory effort.  No retractions. Lungs CTAB. Gastrointestinal: Soft and nontender. No distention.  Musculoskeletal: On examination of the right shoulder there is no gross deformity however there is tenderness on palpation of the proximal humerus.  No crepitus is noted with range of motion and range of motion is markedly restricted secondary to increased pain and reproduction of her discomfort.  Skin is intact.  No discoloration or skin injury noted.  Pulses present  distally.  Motor sensory function intact distally.  Capillary refills less than 3 seconds. Neurologic:  Normal speech and language. No gross focal neurologic deficits are appreciated.  Skin:  Skin is warm, dry and intact. No rash noted. Psychiatric: Mood and affect are normal. Speech and behavior are normal.  ____________________________________________   LABS (all labs ordered are listed, but only abnormal results are displayed)  Labs Reviewed  POC URINE PREG, ED   ____________________________________________  EKG   ____________________________________________  RADIOLOGY Beaulah Corin, personally  viewed and evaluated these images (plain radiographs) as part of my medical decision making, as well as reviewing the written report by the radiologist.   Official radiology report(s): DG Shoulder Right  Result Date: 12/09/2020 CLINICAL DATA:  Right shoulder pain. EXAM: RIGHT SHOULDER - 2+ VIEW COMPARISON:  No prior. FINDINGS: No evidence of fracture, dislocation, or separation. Subchondral in the proximal humerus cannot be completely excluded. To exclude a focal significant proximal humeral lesion MRI of the right shoulder suggested. IMPRESSION: 1. Subchondral cyst in the proximal right humerus cannot be completely excluded. To exclude a focal lesion MRI of the right shoulder suggested. 2.  No evidence of fracture, dislocation, or separation. Electronically Signed   By: Maisie Fus  Register   On: 12/09/2020 08:25   MR SHOULDER RIGHT WO CONTRAST  Result Date: 12/09/2020 CLINICAL DATA:  Right shoulder pain after feeling a pop lifting a large mixing bowl. Possible proximal humerus lesion seen on x-ray. EXAM: MRI OF THE RIGHT SHOULDER WITHOUT CONTRAST TECHNIQUE: Multiplanar, multisequence MR imaging of the shoulder was performed. No intravenous contrast was administered. COMPARISON:  Right shoulder x-rays from same day. FINDINGS: Rotator cuff: Intact rotator cuff. Mild distal supraspinatus tendinosis. Muscles: No atrophy or abnormal signal of the muscles of the rotator cuff. Biceps long head:  Intact and normally positioned. Acromioclavicular Joint: Normal acromioclavicular joint. Type II acromion. No significant subacromial/subdeltoid bursal fluid. Glenohumeral Joint: No joint effusion. No chondral defect. Labrum: Grossly intact, but evaluation is limited by lack of intraarticular fluid. Bones: Small acute nondisplaced fracture of the distal clavicle with surrounding marrow edema (series 5, image 4). No dislocation. No suspicious bone lesion. Other: None. IMPRESSION: 1. Small acute nondisplaced fracture of the  distal clavicle. 2. Intact rotator cuff.  Mild distal supraspinatus tendinosis. 3. No proximal humerus bone lesion. Electronically Signed   By: Obie Dredge M.D.   On: 12/09/2020 09:46    ____________________________________________   PROCEDURES  Procedure(s) performed (including Critical Care):  Procedures   ____________________________________________   INITIAL IMPRESSION / ASSESSMENT AND PLAN / ED COURSE  As part of my medical decision making, I reviewed the following data within the electronic MEDICAL RECORD NUMBER Notes from prior ED visits and [place] Controlled Substance Database  35 year old female presents to the ED with complaint of right shoulder pain that occurred at approximately 6 AM while she was at work.  Patient states that she was lifting a heavy mixing bowl to place it into a dishwasher when she began having pain in her right shoulder.  She denies any previous injury to her shoulder.  Range of motion is decreased but no gross deformity was noted.  Plain x-rays show a cyst to the proximal aspect of the humerus.  It was suggested by radiology since this was most likely a cyst that it be followed up by an MRI.  MRI suggest that there is a nondisplaced acute fracture to the distal clavicle which with patient's history is unlikely but not completely ruled out.  Patient continues to have discomfort with range of motion.  She was placed in a sling and a prescription for hydrocodone was sent to her pharmacy.  She was given a note for her work stating that she has limited to no use of her right arm until evaluated by the orthopedist.  Patient is also use ice to her shoulder as needed for discomfort.  Dr. Rosita Kea is on-call for orthopedics and patient was encouraged to call and make an appointment for evaluation next week.  ____________________________________________   FINAL CLINICAL IMPRESSION(S) / ED DIAGNOSES  Final diagnoses:  Acute pain of right shoulder     ED Discharge Orders           Ordered    HYDROcodone-acetaminophen (NORCO/VICODIN) 5-325 MG tablet  Every 6 hours PRN        12/09/20 1016             Note:  This document was prepared using Dragon voice recognition software and may include unintentional dictation errors.    Tommi Rumps, PA-C 12/09/20 1038    Merwyn Katos, MD 12/09/20 [phone]

## 2020-12-09 NOTE — Discharge Instructions (Addendum)
Call make an appoint with Dr. Rosita Kea who is the orthopedist on-call.  His office is located in [facility].  His phone number is listed on your discharge papers.  Ice and elevation as needed for your shoulder pain.  Wear the shoulder sling for support and protection.  A prescription for pain medication was sent to your pharmacy.  Do not take this medication other than how it is prescribed.  Also note was written for work letting them know that you have little to no use of your right arm until you have been seen and evaluated by the orthopedist.

## 2020-12-09 NOTE — ED Notes (Signed)
See triage note  States she was lifting a heavy mixing bowl at work  [employer] a pop in right shoulder  Good pulses

## 2020-12-09 NOTE — ED Notes (Signed)
Per [facility], urine drug test required. WC was performed and taken to lab by this tech.

## 2020-12-09 NOTE — ED Triage Notes (Signed)
Pt comes into the ED via POV c/o right shoulder pain.  Pt states she was at work this morning and was lifting a large mixing bowl into the dishwasher when she felt a "pop".  Pt has no deformity noted to the shoulder at this time.  Pt in NAD and is ambulatory to triage.

## 2022-01-19 IMAGING — MR MR SHOULDER*R* W/O CM
4 of 5 series · 31 of 40 positions shown · non-contrast
Comparison: Right shoulder x-rays from same day.

CLINICAL DATA: Right shoulder pain after feeling a pop lifting a
large mixing bowl. Possible proximal humerus lesion seen on x-ray.

EXAM:
MRI OF THE RIGHT SHOULDER WITHOUT CONTRAST
TECHNIQUE: Multiplanar, multisequence MR imaging of the shoulder was performed.
No intravenous contrast was administered.

[Series 5: T2 fat-sat · axial · right · 4.0mm · 0.44mm/px · z∈[-25,+88]mm · 8 of 26 slices shown (1 of 3)]
[im 1/26]
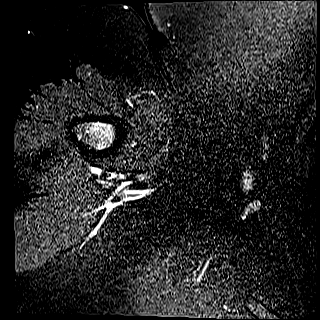
[im 4/26]
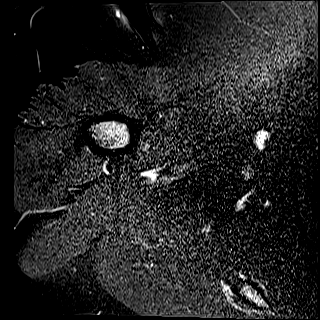
[im 8/26]
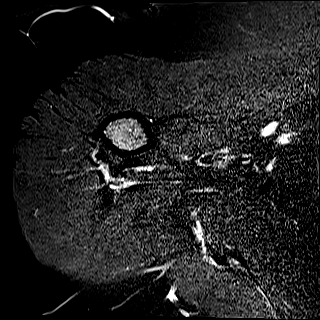
[im 11/26]
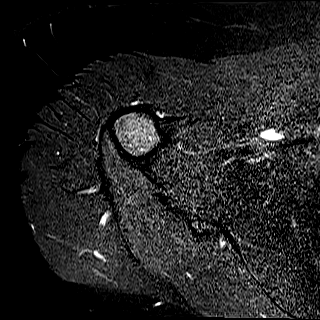
[im 15/26]
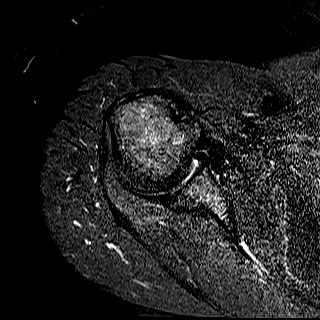
[im 18/26]
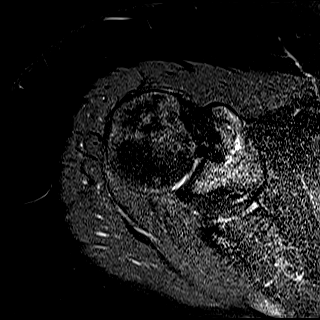
[im 22/26]
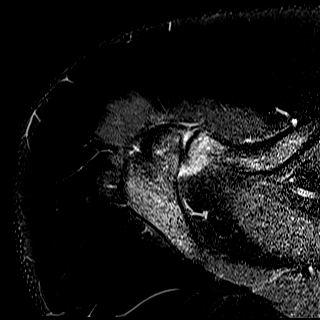
[im 26/26]
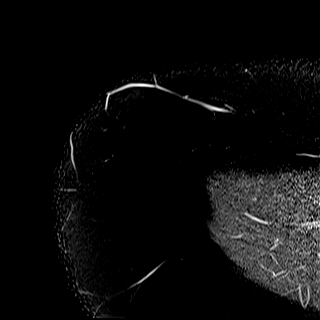

[Series 6: PD · oblique · right · 4.0mm · 0.44mm/px · 9 of 26 slices shown]
[im 1/26]
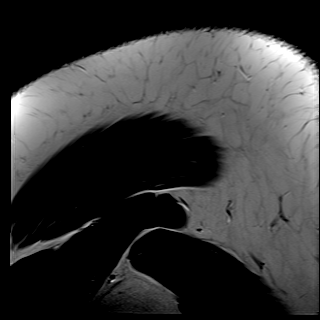
[im 4/26]
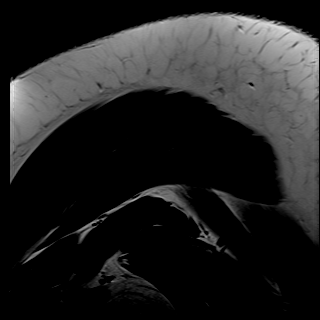
[im 7/26]
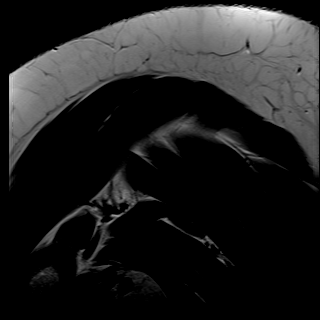
[im 10/26]
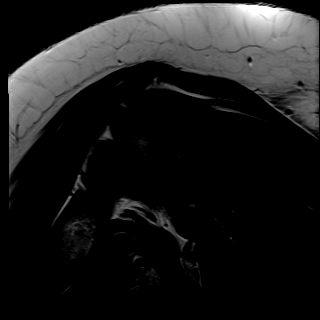
[im 13/26]
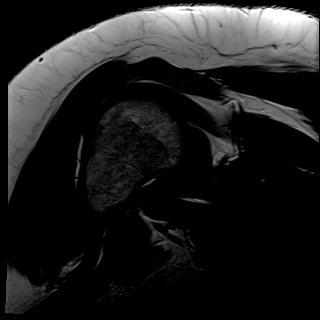
[im 16/26]
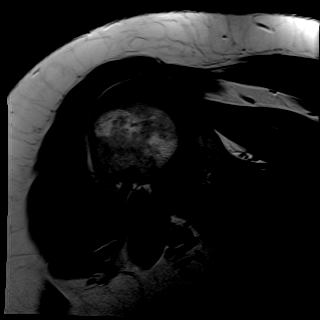
[im 19/26]
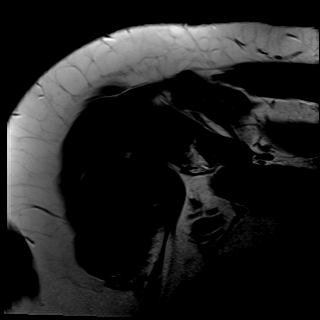
[im 22/26]
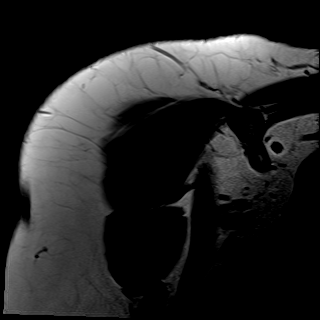
[im 26/26]
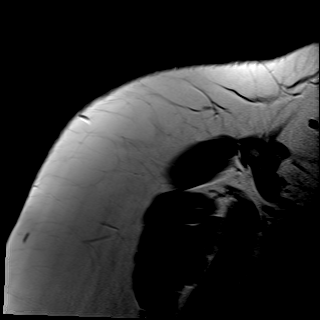

[Series 7: T2 fat-sat · oblique · right · 4.0mm · 0.44mm/px · 9 of 26 slices shown (2 of 3)]
[im 1/26]
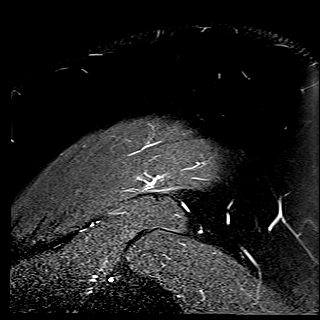
[im 4/26]
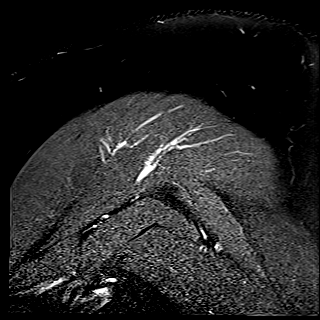
[im 7/26]
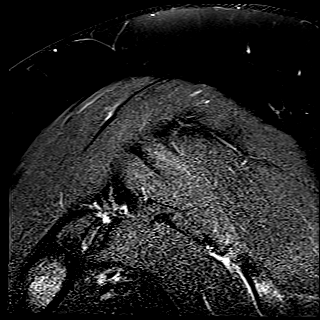
[im 10/26]
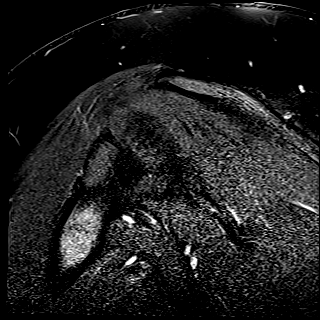
[im 13/26]
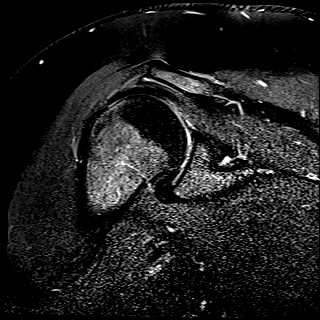
[im 16/26]
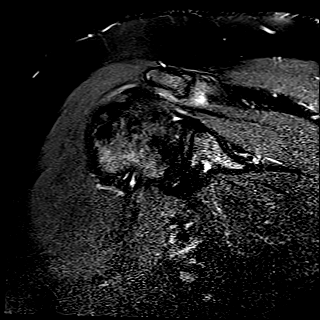
[im 19/26]
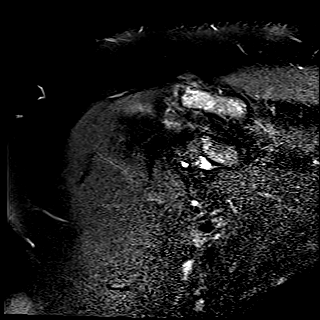
[im 22/26]
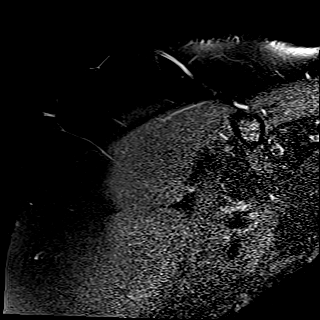
[im 26/26]
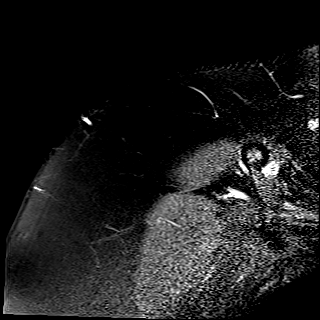

[Series 8: T2 fat-sat · oblique · right · 4.0mm · 0.22mm/px · 5 of 22 slices shown (3 of 3)]
[im 1/22]
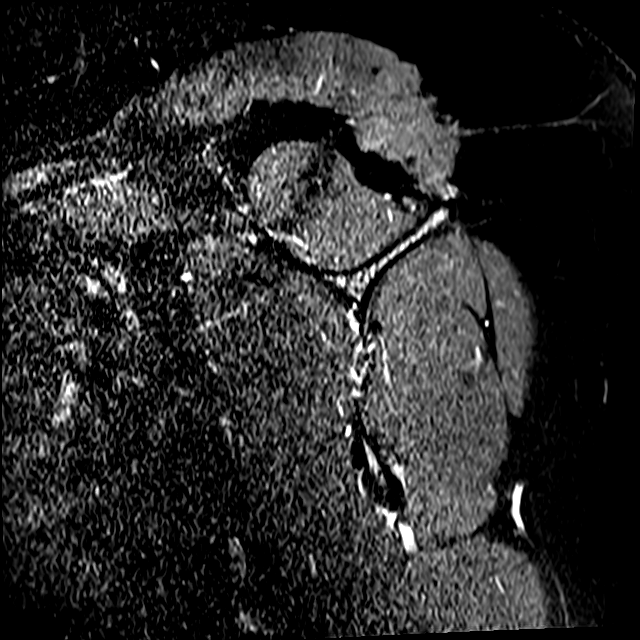
[im 4/22]
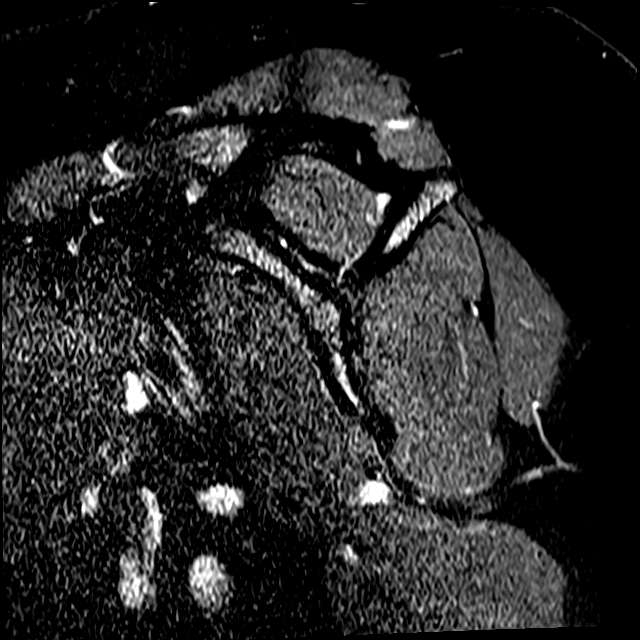
[im 8/22]
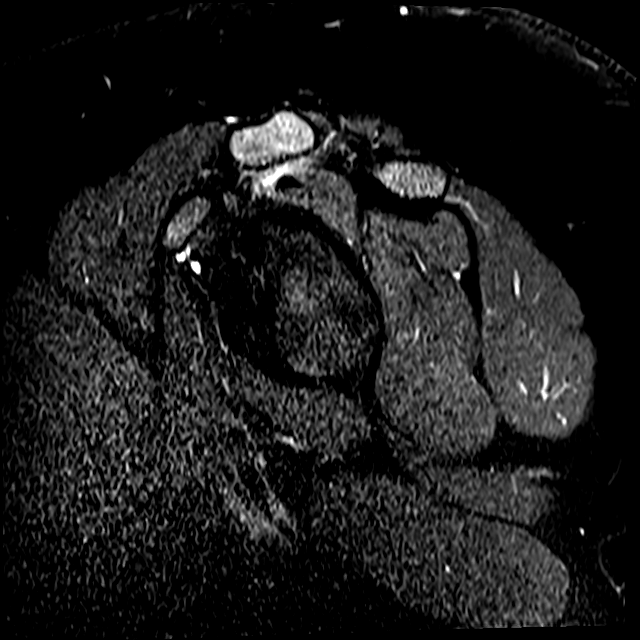
[im 11/22]
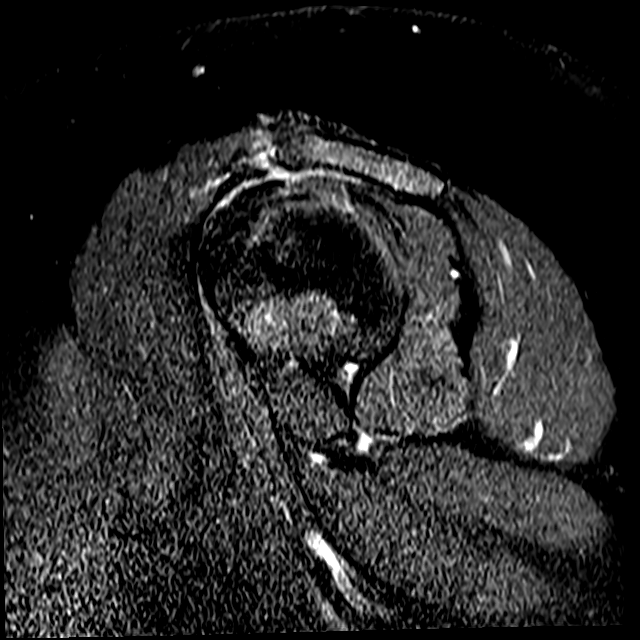
[im 18/22]
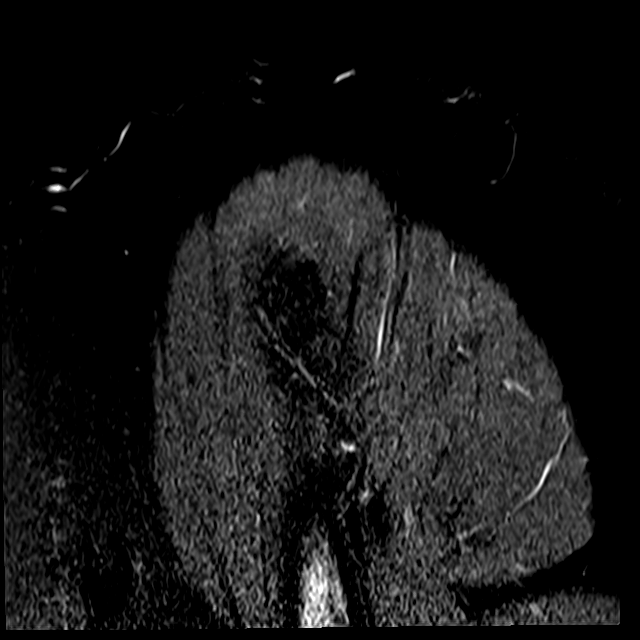

[31 of 40 positions shown; findings below may reference images not displayed]

FINDINGS: Rotator cuff: Intact rotator cuff. Mild distal supraspinatus
tendinosis.

Muscles: No atrophy or abnormal signal of the muscles of the rotator
cuff.

Biceps long head:  Intact and normally positioned.

Acromioclavicular Joint: Normal acromioclavicular joint. Type II
acromion. No significant subacromial/subdeltoid bursal fluid.

Glenohumeral Joint: No joint effusion. No chondral defect.

Labrum: Grossly intact, but evaluation is limited by lack of
intraarticular fluid.

Bones: Small acute nondisplaced fracture of the distal clavicle with
surrounding marrow edema (series 5, image 4). No dislocation. No
suspicious bone lesion.

Other: None.
IMPRESSION: 1. Small acute nondisplaced fracture of the distal clavicle.
2. Intact rotator cuff.  Mild distal supraspinatus tendinosis.
3. No proximal humerus bone lesion.

## 2022-10-10 ENCOUNTER — Other Ambulatory Visit: Payer: Self-pay

## 2022-10-10 ENCOUNTER — Emergency Department: Payer: Managed Care, Other (non HMO)

## 2022-10-10 ENCOUNTER — Emergency Department
Admission: EM | Admit: 2022-10-10 | Discharge: 2022-10-10 | Disposition: A | Discharge status: Home / Self Care | Payer: Managed Care, Other (non HMO) | Attending: Emergency Medicine | Admitting: Emergency Medicine

## 2022-10-10 DIAGNOSIS — N939 Abnormal uterine and vaginal bleeding, unspecified: Secondary | ICD-10-CM | POA: Diagnosis present

## 2022-10-10 DIAGNOSIS — Z5329 Procedure and treatment not carried out because of patient's decision for other reasons: Secondary | ICD-10-CM | POA: Insufficient documentation

## 2022-10-10 DIAGNOSIS — D219 Benign neoplasm of connective and other soft tissue, unspecified: Secondary | ICD-10-CM

## 2022-10-10 LAB — CBC
HCT: 40 % (ref 36.0–46.0)
Hemoglobin: 12.6 g/dL (ref 12.0–15.0)
MCH: 27.4 pg (ref 26.0–34.0)
MCHC: 31.5 g/dL (ref 30.0–36.0)
MCV: 87 fL (ref 80.0–100.0)
Platelets: 355 10*3/uL (ref 150–400)
RBC: 4.6 MIL/uL (ref 3.87–5.11)
RDW: 14 % (ref 11.5–15.5)
WBC: 11.2 10*3/uL — ABNORMAL HIGH (ref 4.0–10.5)
nRBC: 0 % (ref 0.0–0.2)

## 2022-10-10 LAB — BASIC METABOLIC PANEL
Anion gap: 14 (ref 5–15)
BUN: 12 mg/dL (ref 6–20)
CO2: 20 mmol/L — ABNORMAL LOW (ref 22–32)
Calcium: 9.5 mg/dL (ref 8.9–10.3)
Chloride: 101 mmol/L (ref 98–111)
Creatinine, Ser: 0.67 mg/dL (ref 0.44–1.00)
GFR, Estimated: >60 mL/min (ref >60)
Glucose, Bld: 96 mg/dL (ref 70–99)
Potassium: 3.9 mmol/L (ref 3.5–5.1)
Sodium: 135 mmol/L (ref 135–145)

## 2022-10-10 LAB — PREGNANCY, URINE: Preg Test, Ur: NEGATIVE

## 2022-10-10 MED ORDER — HYDROCODONE-ACETAMINOPHEN 5-325 MG PO TABS: 1.0000 | ORAL_TABLET | ORAL | 0 refills | Status: DC | PRN

## 2022-10-10 MED ORDER — HYDROCODONE-ACETAMINOPHEN 5-325 MG PO TABS
1.0000 | ORAL_TABLET | Freq: Once | ORAL | Status: AC
  Administered 2022-10-10: 1 via ORAL
  Filled 2022-10-10: qty 1

## 2022-10-10 NOTE — ED Provider Notes (Signed)
Emergency department handoff note  Care of this patient was signed out to me at the end of the previous provider shift.  All pertinent patient information was conveyed and all questions were answered.  Patient pending pelvic ultrasound to evaluate further patient's vaginal bleeding.  Prior to this evaluation be performed, patient decided to leave AMA This patient has elected to leave against medical advice. In my opinion, the patient has capacity to leave AMA. The patient is clinically sober, free from distracting injury, appears to have intact insight and judgment and reason, and in my opinion has capacity to make decisions. I explained to the patient that his symptoms may represent abnormal uterine bleeding and the patient verbalized understanding of my concerns.  I had a discussion with the patient about their workup and results, and that they may still have abnormal uterine bleeding secondary to fibroid tumors despite the symptoms not being as severe this time. I informed the patient that the next step in diagnosis and treatment would be pelvic ultrasounds, and they verbalized understanding of this as well. I explained the risks of leaving without further workup or treatment, which included reasonably foreseeable complications such as death, serious injury, permanent disability, and possible infertility. I also offered alternatives to departing AMA such as assigning the patient a different provider or an alternate workup pathway.  The patient is refusing any further care, specifically pelvic ultrasound, and is leaving against medical advice. I am unable to convince the patient to stay. I have asked them to return as soon as possible to complete their evaluation, and also explained that they were welcome to return to the ER for further evaluation whenever they choose. I have asked the patient to follow up with their primary doctor as soon as possible. I have answered all their questions. Patient signed AMA  paperwork.   Merwyn Katos, MD 10/10/22 [phone]

## 2022-10-10 NOTE — ED Notes (Signed)
Lab called by this rn.  Lab has the urine.

## 2022-10-10 NOTE — ED Notes (Signed)
Pain meds given  pt waiting on u/s

## 2022-10-10 NOTE — ED Provider Notes (Signed)
[facility] Provider Note    Event Date/Time   First MD Initiated Contact with Patient 10/10/22 1341     (approximate)   History   Vaginal Bleeding   HPI  Brittany Nicholson is a 37 y.o. female   presents to the ED with complaint of vaginal bleeding that started 1 day ago.  Patient reports that she is going through a tampon and a pad every hour.  Patient has had experience with the same and has a history of endometriosis.  She reports that she is also having painful cramps with this.  She reports that she tried to set up an appointment with her OB/GYN but was unsuccessful.  Patient was also seen at urgent care this morning for her blood pressure was elevated, she denies any previous history of hypertension.  She denies any headache, chest pain, dizziness, shortness of breath.      Physical Exam   Triage Vital Signs: ED Triage Vitals  Enc Vitals Group     BP 10/10/22 1233 (!) 165/129     Pulse Rate 10/10/22 1233 (!) 106     Resp 10/10/22 1233 18     Temp 10/10/22 1233 98.9 F (37.2 C)     Temp src --      SpO2 10/10/22 1233 100 %     Weight 10/10/22 1234 265 lb (120.2 kg)     Height 10/10/22 1234 5\' 2"  (1.575 m)     Head Circumference --      Peak Flow --      Pain Score 10/10/22 1234 9     Pain Loc --      Pain Edu? --      Excl. in GC? --     Most recent vital signs: Vitals:   10/10/22 1444 10/10/22 1538  BP: (!) 159/94 (!) 159/94  Pulse:  (!) 106  Resp:  18  Temp:  98.9 F (37.2 C)  SpO2:  100%     General: Awake, no distress.  Alert, talkative. CV:  Good peripheral perfusion.  Heart regular rate rhythm. Resp:  Normal effort.  Lungs are clear bilaterally. Abd:  No distention.  Soft, bowel sounds are present x 4 quadrants.  There is some generalized tenderness to the lower pelvic area. Other:     ED Results / Procedures / Treatments   Labs (all labs ordered are listed, but only abnormal results are displayed) Labs Reviewed  CBC -  Abnormal; Notable for the following components:      Result Value   WBC 11.2 (*)    All other components within normal limits  BASIC METABOLIC PANEL - Abnormal; Notable for the following components:   CO2 20 (*)    All other components within normal limits  PREGNANCY, URINE      RADIOLOGY Ultrasound non-OB transvaginal pending.    PROCEDURES:  Critical Care performed:   Procedures   MEDICATIONS ORDERED IN ED: Medications  HYDROcodone-acetaminophen (NORCO/VICODIN) 5-325 MG per tablet 1 tablet (1 tablet Oral Given 10/10/22 1526)     IMPRESSION / MDM / ASSESSMENT AND PLAN / ED COURSE  I reviewed the triage vital signs and the nursing notes.   Differential diagnosis includes, but is not limited to, abnormal uterine bleeding, endometriosis, uterine fibroid, ovarian cyst, pelvic mass.  ----------------------------------------- 3:39 PM on 10/10/2022 ----------------------------------------- Patient made aware of her lab work and a non-OB ultrasound transvaginal has been ordered.  I spoke to Dr. Vicente Males who is taking over the  care of this patient at this time.      Patient's presentation is most consistent with acute illness / injury with system symptoms.  FINAL CLINICAL IMPRESSION(S) / ED DIAGNOSES   Final diagnoses:  Abnormal uterine bleeding (AUB)     Rx / DC Orders   ED Discharge Orders     None        Note:  This document was prepared using Dragon voice recognition software and may include unintentional dictation errors.   Tommi Rumps, PA-C 10/10/22 1540    Phineas Semen, MD 10/10/22 [phone]

## 2022-10-10 NOTE — ED Triage Notes (Signed)
Pt to ED for heavy vaginal bleeding and painful menstrual cramps started yesterday. Reports endometriosis.

## 2022-10-26 ENCOUNTER — Encounter: Payer: Self-pay | Admitting: Certified Nurse Midwife

## 2022-10-26 ENCOUNTER — Ambulatory Visit: Payer: Managed Care, Other (non HMO) | Admitting: Certified Nurse Midwife

## 2022-10-26 VITALS — BP 147/98 | HR 101 | Wt 267.7 lb

## 2022-10-26 DIAGNOSIS — N809 Endometriosis, unspecified: Secondary | ICD-10-CM | POA: Diagnosis not present

## 2022-10-26 DIAGNOSIS — N939 Abnormal uterine and vaginal bleeding, unspecified: Secondary | ICD-10-CM | POA: Diagnosis not present

## 2022-10-26 DIAGNOSIS — N946 Dysmenorrhea, unspecified: Secondary | ICD-10-CM

## 2022-10-26 MED ORDER — HYDROCODONE-ACETAMINOPHEN 5-325 MG PO TABS: 1.0000 | ORAL_TABLET | ORAL | 0 refills | Status: DC | PRN

## 2022-10-26 MED ORDER — NORETHINDRONE ACETATE 5 MG PO TABS
5.0000 mg | ORAL_TABLET | Freq: Every day | ORAL | 3 refills | Status: DC
Start: 2022-10-26 — End: 2023-07-30

## 2022-10-26 NOTE — Progress Notes (Signed)
GYN ENCOUNTER NOTE  Subjective:       Brittany Nicholson is a 37 y.o. No obstetric history on file. female is here for gynecologic evaluation of the following issues:  1. Abnormal uterine bleeding with painful cramps 10/10. Pt states she has history of endometriosis which was diagnosed during her tubal procedure. She has always had heavy periods 6- 7 days with cramping the first 3 days that would resolve. Recently ( the last 2 months) the cramping has increased. She went to the ED the other day due to the pain. At the ED. She had an ultrasound ( see below).     Gynecologic History Patient's last menstrual period was 10/09/2022 (exact date). Contraception: tubal ligation Last Pap: has not had in a long time .  Last mammogram: n/a.   Obstetric History OB History  No obstetric history on file.    Past Medical History:  Diagnosis Date   Endometriosis     Past Surgical History:  Procedure Laterality Date   TUBAL LIGATION      Current Outpatient Medications on File Prior to Visit  Medication Sig Dispense Refill   HYDROcodone-acetaminophen (NORCO) 5-325 MG tablet Take 1-2 tablets by mouth every 4 (four) hours as needed for moderate pain. 15 tablet 0   No current facility-administered medications on file prior to visit.    Allergies  Allergen Reactions   Aspirin Swelling   Ibuprofen Swelling   Nsaids Swelling    Social History   Socioeconomic History   Marital status: Married    Spouse name: Not on file   Number of children: Not on file   Years of education: Not on file   Highest education level: Not on file  Occupational History   Not on file  Tobacco Use   Smoking status: Every Day   Smokeless tobacco: Not on file  Substance and Sexual Activity   Alcohol use: Yes   Drug use: Not on file   Sexual activity: Not on file  Other Topics Concern   Not on file  Social History Narrative   Not on file   Social Determinants of Health   Financial Resource Strain: Not on file   Food Insecurity: Not on file  Transportation Needs: Not on file  Physical Activity: Not on file  Stress: Not on file  Social Connections: Not on file  Intimate Partner Violence: Not on file    History reviewed. No pertinent family history.  The following portions of the patient's history were reviewed and updated as appropriate: allergies, current medications, past family history, past medical history, past social history, past surgical history and problem list.  Review of Systems Review of Systems - Negative except as mentioned in HPI Review of Systems - General ROS: negative for - chills, fatigue, fever, hot flashes, malaise or night sweats Hematological and Lymphatic ROS: negative for - bleeding problems or swollen lymph nodes Gastrointestinal ROS: negative for - abdominal pain, blood in stools, change in bowel habits and nausea/vomiting Musculoskeletal ROS: negative for - joint pain, muscle pain or muscular weakness Genito-Urinary ROS: negative for - change in menstrual cycle, dysmenorrhea, dyspareunia, dysuria, genital discharge, genital ulcers, hematuria, incontinence, irregular menses, nocturia or pelvic pain. Positive for heavy painful periods   Objective:   BP (!) 147/98   Pulse (!) 101   Wt 267 lb 11.2 oz (121.4 kg)   LMP 10/09/2022 (Exact Date)   BMI 48.96 kg/m  CONSTITUTIONAL: Well-developed, well-nourished female in no acute distress.  HENT:  Normocephalic, atraumatic.  NECK: Normal range of motion, supple, no masses.  Normal thyroid.  SKIN: Skin is warm and dry. No rash noted. Not diaphoretic. No erythema. No pallor. NEUROLGIC: Alert and oriented to person, place, and time. PSYCHIATRIC: Normal mood and affect. Normal behavior. Normal judgment and thought content. CARDIOVASCULAR:Not Examined RESPIRATORY: Not Examined BREASTS: Not Examined ABDOMEN: Soft, non distended; Non tender.  No Organomegaly. PELVIC: not indicated  MUSCULOSKELETAL: Normal range of motion. No  tenderness.  No cyanosis, clubbing, or edema.  CLINICAL DATA:  Vaginal bleeding with pelvic pain.   EXAM: TRANSABDOMINAL ULTRASOUND OF PELVIS   TECHNIQUE: Transabdominal ultrasound examination of the pelvis was performed including evaluation of the uterus, ovaries, adnexal regions, and pelvic cul-de-sac.   COMPARISON:  None Available.   FINDINGS: Uterus   Measurements: 7.8 x 3.4 x 5.0 cm = volume: 70 mL. Small cluster of calcifications is seen in the left fundal region measuring 8 x 9 x 12 mm which may represent calcified uterine fibroid. Nabothian cysts are noted in the cervix.   Endometrium   Thickness: 5.2 mm. No focal abnormality visualized. Uation or small abnormalities is limited secondary to transabdominal scanning only point   Right ovary   Measurements: 2.2 x 1.2 x 2.3 cm = volume: 4 mL. Normal appearance/no adnexal mass.   Left ovary   Measurements: 2.1 x 1.1 x 1.9 cm = volume: 2 mL. Normal appearance/no adnexal mass.   Other findings:  No abnormal free fluid.   IMPRESSION: 1. Small cluster of calcifications in the left fundal region measuring 8 x 9 x 12 mm which may represent calcified uterine fibroid. 2. Endometrial stripe thickness is 5.2 mm. If bleeding remains unresponsive to hormonal or medical therapy, sonohysterogram should be considered for focal lesion work-up. (Ref: Radiological Reasoning: Algorithmic Workup of Abnormal Vaginal Bleeding with Endovaginal Sonography and Sonohysterography. AJR 2008; 161:W96-04)     Electronically Signed   By: Darliss Cheney M.D.   On: 10/10/2022 17:48   Assessment:   Abnormal uterine bleeding Endometriosis  dysmenorrhea   Plan:   Pt state she was given pain pill in the ED and it was the only thing that would help the pain. She has allergy to NSAIDs and is only able to take tylenol over the counter. Discussed refill for Vicodin to take sparingly. Discussed treatment options. She declines birth control.  She is open to use of progestins. Aygestin ordered.  She inquired about weight loss. Discussed weight in relationship to her cycle . Suggest that weight loss could be beneficial . She inquiring about use of wagoviy for wt loss. Discussed that she should find out from her insurance if it is covered. She will let me know .   Follow up as soon as able for annual/pap smear.   Doreene Burke, CNM

## 2023-02-09 ENCOUNTER — Other Ambulatory Visit: Payer: Self-pay | Admitting: Certified Nurse Midwife

## 2023-02-15 MED ORDER — HYDROCODONE-ACETAMINOPHEN 5-325 MG PO TABS: 1.0000 | ORAL_TABLET | ORAL | 0 refills | Status: DC | PRN

## 2023-02-20 ENCOUNTER — Encounter: Payer: Self-pay | Admitting: Family Medicine

## 2023-02-20 ENCOUNTER — Ambulatory Visit: Payer: Managed Care, Other (non HMO) | Admitting: Family Medicine

## 2023-02-20 DIAGNOSIS — F39 Unspecified mood [affective] disorder: Secondary | ICD-10-CM | POA: Diagnosis not present

## 2023-02-20 DIAGNOSIS — Z1159 Encounter for screening for other viral diseases: Secondary | ICD-10-CM | POA: Insufficient documentation

## 2023-02-20 DIAGNOSIS — K2100 Gastro-esophageal reflux disease with esophagitis, without bleeding: Secondary | ICD-10-CM | POA: Diagnosis not present

## 2023-02-20 DIAGNOSIS — N719 Inflammatory disease of uterus, unspecified: Secondary | ICD-10-CM | POA: Insufficient documentation

## 2023-02-20 DIAGNOSIS — I10 Essential (primary) hypertension: Secondary | ICD-10-CM | POA: Diagnosis not present

## 2023-02-20 DIAGNOSIS — Z114 Encounter for screening for human immunodeficiency virus [HIV]: Secondary | ICD-10-CM | POA: Insufficient documentation

## 2023-02-20 MED ORDER — TIRZEPATIDE-WEIGHT MANAGEMENT 2.5 MG/0.5ML ~~LOC~~ SOAJ
2.5000 mg | SUBCUTANEOUS | 0 refills | Status: AC
Start: 2023-02-20 — End: ?

## 2023-02-20 MED ORDER — PANTOPRAZOLE SODIUM 40 MG PO TBEC: 40.0000 mg | DELAYED_RELEASE_TABLET | Freq: Every day | ORAL | 0 refills | Status: DC

## 2023-02-20 MED ORDER — ONDANSETRON HCL 4 MG PO TABS: 4.0000 mg | ORAL_TABLET | Freq: Three times a day (TID) | ORAL | 0 refills | Status: AC | PRN

## 2023-02-20 MED ORDER — LOSARTAN POTASSIUM-HCTZ 50-12.5 MG PO TABS
1.0000 | ORAL_TABLET | Freq: Every day | ORAL | 0 refills | Status: DC
Start: 2023-02-20 — End: 2023-04-18

## 2023-02-20 NOTE — Assessment & Plan Note (Signed)
Chronic, worsening Reports +symptoms with use of prevacid; will increase to protonix given previous failure of prilosec 1 month referral recommended

## 2023-02-20 NOTE — Assessment & Plan Note (Signed)
Low risk screen ?Consented; encouraged to "know your status" ?Recommend repeat screen if risk factors change ? ?

## 2023-02-20 NOTE — Assessment & Plan Note (Signed)
Low risk screen Treatable, and curable. If left untreated Hep C can lead to cirrhosis and liver failure. Encourage routine testing; recommend repeat testing if risk factors change.  

## 2023-02-20 NOTE — Assessment & Plan Note (Signed)
Chronic; reports mainly symptoms of anziety Previously on benzo, daily meds including SSRI/SNRI and Abilify  Declines restart at this time

## 2023-02-20 NOTE — Patient Instructions (Addendum)
Reach out after 3 weeks to request next dose of Zepbound  Start daily medication for hypertension and GERD

## 2023-02-20 NOTE — Progress Notes (Signed)
New patient visit   Patient: Brittany Nicholson   DOB: 1985-07-19   37 y.o. Female  MRN: [id number] Visit Date: 02/20/2023  Today's healthcare provider: Jacky Kindle, FNP  Patient presents for new patient visit to establish care.  Introduced to Publishing rights manager role and practice setting.  All questions answered.  Discussed provider/patient relationship and expectations.  Chief Complaint  Patient presents with   New Patient (Initial Visit)    Patient would like to discuss weight loss options and pressure in tailbone when sitting after falling incident. She reports falling over her dog last Monday and fell back on her tail bone on the steps (wooden). Pain level is 7-8/10 when sitting and walking. Reports it spider webs to her hips and lower back. Taking pain med's previously prescribed by ob due to endometriosis and fibroids.    Subjective    Brittany Nicholson is a 37 y.o. female who presents today as a new patient to establish care.  HPI HPI     New Patient (Initial Visit)    Additional comments: Patient would like to discuss weight loss options and pressure in tailbone when sitting after falling incident. She reports falling over her dog last Monday and fell back on her tail bone on the steps (wooden). Pain level is 7-8/10 when sitting and walking. Reports it spider webs to her hips and lower back. Taking pain med's previously prescribed by ob due to endometriosis and fibroids.       Last edited by Acey Lav, CMA on 02/20/2023 10:13 AM.      Past Medical History:  Diagnosis Date   Anxiety    Depression    Endometriosis    Fibroids    GERD (gastroesophageal reflux disease)    Past Surgical History:  Procedure Laterality Date   TUBAL LIGATION     Family Status  Relation Name Status   MGM  (Not Specified)  No partnership data on file   Family History  Problem Relation Age of Onset   Stroke Maternal Grandmother 48 - 28   Social History   Socioeconomic History   Marital  status: Married    Spouse name: Not on file   Number of children: Not on file   Years of education: Not on file   Highest education level: Not on file  Occupational History   Not on file  Tobacco Use   Smoking status: Every Day   Smokeless tobacco: Never  Substance and Sexual Activity   Alcohol use: Yes    Alcohol/week: 12.0 standard drinks of alcohol    Types: 12 Cans of beer per week   Drug use: Never   Sexual activity: Not on file  Other Topics Concern   Not on file  Social History Narrative   Not on file   Social Determinants of Health   Financial Resource Strain: Not on file  Food Insecurity: Not on file  Transportation Needs: Not on file  Physical Activity: Not on file  Stress: Not on file  Social Connections: Not on file   Outpatient Medications Prior to Visit  Medication Sig   HYDROcodone-acetaminophen (NORCO) 5-325 MG tablet Take 1-2 tablets by mouth every 4 (four) hours as needed for moderate pain.   norethindrone (AYGESTIN) 5 MG tablet Take 1 tablet (5 mg total) by mouth daily.   [DISCONTINUED] esomeprazole (NEXIUM) 20 MG capsule Take 20 mg by mouth daily at 12 noon.   No facility-administered medications prior to visit.   Allergies  Allergen  Reactions   Aspirin Swelling   Ibuprofen Swelling   Nsaids Swelling    Immunization History  Administered Date(s) Administered   PFIZER(Purple Top)SARS-COV-2 Vaccination 11/04/2019, 11/25/2019    Health Maintenance  Topic Date Due   HIV Screening  Never done   Hepatitis C Screening  Never done   DTaP/Tdap/Td (1 - Tdap) Never done   PAP SMEAR-Modifier  Never done   COVID-19 Vaccine (3 - 2023-24 season) 02/11/2023   INFLUENZA VACCINE  09/10/2023 (Originally 01/11/2023)   HPV VACCINES  Aged Out    Patient Care Team: Jacky Kindle, FNP as PCP - General (Family Medicine)  Review of Systems  Objective    BP (!) 149/82 (BP Location: Left Arm, Patient Position: Sitting, Cuff Size: Normal)   Pulse 79   Ht 5'  2" (1.575 m)   Wt 250 lb 6.4 oz (113.6 kg)   SpO2 100%   BMI 45.80 kg/m   Physical Exam Vitals and nursing note reviewed.  Constitutional:      General: She is not in acute distress.    Appearance: Normal appearance. She is obese. She is not ill-appearing, toxic-appearing or diaphoretic.  HENT:     Head: Normocephalic and atraumatic.  Cardiovascular:     Rate and Rhythm: Normal rate and regular rhythm.     Pulses: Normal pulses.     Heart sounds: Normal heart sounds. No murmur heard.    No friction rub. No gallop.  Pulmonary:     Effort: Pulmonary effort is normal. No respiratory distress.     Breath sounds: Normal breath sounds. No stridor. No wheezing, rhonchi or rales.  Chest:     Chest wall: No tenderness.  Musculoskeletal:        General: Tenderness present. No swelling, deformity or signs of injury. Normal range of motion.     Right lower leg: No edema.     Left lower leg: No edema.     Comments: Low back pain from fall; declines imaging at this time   Skin:    General: Skin is warm and dry.     Capillary Refill: Capillary refill takes less than 2 seconds.     Coloration: Skin is not jaundiced or pale.     Findings: No bruising, erythema, lesion or rash.  Neurological:     General: No focal deficit present.     Mental Status: She is alert and oriented to person, place, and time. Mental status is at baseline.     Cranial Nerves: No cranial nerve deficit.     Sensory: No sensory deficit.     Motor: No weakness.     Coordination: Coordination normal.  Psychiatric:        Mood and Affect: Mood normal.        Behavior: Behavior normal.        Thought Content: Thought content normal.        Judgment: Judgment normal.    Depression Screen    02/20/2023   10:16 AM  PHQ 2/9 Scores  PHQ - 2 Score 0   No results found for any visits on 02/20/23.  Assessment & Plan      Problem List Items Addressed This Visit       Cardiovascular and Mediastinum   Primary  hypertension    Chronic, untreated Recommend hyzaar 50-12.5 mg daily Encourage f/u in 1 month Recommend goal of 130/80      Relevant Medications   losartan-hydrochlorothiazide (HYZAAR) 50-12.5 MG tablet  Digestive   Gastroesophageal reflux disease with esophagitis without hemorrhage    Chronic, worsening Reports +symptoms with use of prevacid; will increase to protonix given previous failure of prilosec 1 month referral recommended         Other   Encounter for screening for HIV    Low risk screen Consented; encouraged to "know your status" Recommend repeat screen if risk factors change       Relevant Orders   HIV antibody (with reflex)   Mood disorder (HCC)    Chronic; reports mainly symptoms of anziety Previously on benzo, daily meds including SSRI/SNRI and Abilify  Declines restart at this time       Morbid obesity (HCC) - Primary    Chronic; has managed to lose 20# in 3 months with assistance of low carb diet and intermittent fasting Repeat labs; wishes to try medication to augment Continue to recommend balanced, lower carb meals. Smaller meal size, adding snacks. Choosing water as drink of choice and increasing purposeful exercise.       Relevant Medications   tirzepatide (ZEPBOUND) 2.5 MG/0.5ML Pen   ondansetron (ZOFRAN) 4 MG tablet   Other Relevant Orders   CBC with Differential/Platelet   Comprehensive Metabolic Panel (CMET)   TSH   Lipid panel   Need for hepatitis C screening test    Low risk screen Treatable, and curable. If left untreated Hep C can lead to cirrhosis and liver failure. Encourage routine testing; recommend repeat testing if risk factors change.       Relevant Orders   Hepatitis C Antibody   Return in about 4 weeks (around 03/20/2023) for chonic disease management.    Leilani Merl, FNP, have reviewed all documentation for this visit. The documentation on 02/20/23 for the exam, diagnosis, procedures, and orders are all  accurate and complete.  Jacky Kindle, FNP  [facility] [phone] (phone) [phone] (fax)  [facility]

## 2023-02-20 NOTE — Assessment & Plan Note (Signed)
Chronic; has managed to lose 20# in 3 months with assistance of low carb diet and intermittent fasting Repeat labs; wishes to try medication to augment Continue to recommend balanced, lower carb meals. Smaller meal size, adding snacks. Choosing water as drink of choice and increasing purposeful exercise.

## 2023-02-20 NOTE — Assessment & Plan Note (Signed)
Chronic, untreated Recommend hyzaar 50-12.5 mg daily Encourage f/u in 1 month Recommend goal of 130/80

## 2023-02-21 LAB — COMPREHENSIVE METABOLIC PANEL
ALT: 89 IU/L — ABNORMAL HIGH (ref 0–32)
AST: 50 IU/L — ABNORMAL HIGH (ref 0–40)
Albumin: 4.4 g/dL (ref 3.9–4.9)
Alkaline Phosphatase: 60 IU/L (ref 44–121)
BUN/Creatinine Ratio: 10 (ref 9–23)
BUN: 7 mg/dL (ref 6–20)
Bilirubin Total: 0.3 mg/dL (ref 0.0–1.2)
CO2: 21 mmol/L (ref 20–29)
Calcium: 10 mg/dL (ref 8.7–10.2)
Chloride: 102 mmol/L (ref 96–106)
Creatinine, Ser: 0.68 mg/dL (ref 0.57–1.00)
Globulin, Total: 2.8 g/dL (ref 1.5–4.5)
Glucose: 93 mg/dL (ref 70–99)
Potassium: 4.3 mmol/L (ref 3.5–5.2)
Sodium: 139 mmol/L (ref 134–144)
Total Protein: 7.2 g/dL (ref 6.0–8.5)
eGFR: 116 mL/min/{1.73_m2} (ref >59)

## 2023-02-21 LAB — CBC WITH DIFFERENTIAL/PLATELET
Basophils Absolute: 0.1 10*3/uL (ref 0.0–0.2)
Basos: 1 %
EOS (ABSOLUTE): 0.2 10*3/uL (ref 0.0–0.4)
Eos: 2 %
Hematocrit: 40.3 % (ref 34.0–46.6)
Hemoglobin: 12.9 g/dL (ref 11.1–15.9)
Immature Grans (Abs): 0 10*3/uL (ref 0.0–0.1)
Immature Granulocytes: 0 %
Lymphocytes Absolute: 1.4 10*3/uL (ref 0.7–3.1)
Lymphs: 17 %
MCH: 27.3 pg (ref 26.6–33.0)
MCHC: 32 g/dL (ref 31.5–35.7)
MCV: 85 fL (ref 79–97)
Monocytes Absolute: 0.4 10*3/uL (ref 0.1–0.9)
Monocytes: 5 %
Neutrophils Absolute: 6.3 10*3/uL (ref 1.4–7.0)
Neutrophils: 75 %
Platelets: 342 10*3/uL (ref 150–450)
RBC: 4.73 x10E6/uL (ref 3.77–5.28)
RDW: 14.7 % (ref 11.7–15.4)
WBC: 8.4 10*3/uL (ref 3.4–10.8)

## 2023-02-21 LAB — HIV ANTIBODY (ROUTINE TESTING W REFLEX): HIV Screen 4th Generation wRfx: NONREACTIVE

## 2023-02-21 LAB — LIPID PANEL
Chol/HDL Ratio: 5 ratio — ABNORMAL HIGH (ref 0.0–4.4)
Cholesterol, Total: 216 mg/dL — ABNORMAL HIGH (ref 100–199)
HDL: 43 mg/dL (ref >39)
LDL Chol Calc (NIH): 148 mg/dL — ABNORMAL HIGH (ref 0–99)
Triglycerides: 139 mg/dL (ref 0–149)
VLDL Cholesterol Cal: 25 mg/dL (ref 5–40)

## 2023-02-21 LAB — TSH: TSH: 1 u[IU]/mL (ref 0.450–4.500)

## 2023-02-21 LAB — HEPATITIS C ANTIBODY: Hep C Virus Ab: NONREACTIVE

## 2023-02-23 ENCOUNTER — Telehealth: Payer: Self-pay | Admitting: Family Medicine

## 2023-02-23 NOTE — Telephone Encounter (Signed)
Pt is calling in because she was prescribed tirzepatide (ZEPBOUND) 2.5 MG/0.5ML Pen [[id number]] and the pharmacy informed her that the insurance doesn't cover that medication. Pt wants to know is there an alternative medication that can be prescribed? Please follow up with pt.

## 2023-03-01 ENCOUNTER — Other Ambulatory Visit: Payer: Self-pay | Admitting: Family Medicine

## 2023-03-01 MED ORDER — NALTREXONE HCL 50 MG PO TABS
50.0000 mg | ORAL_TABLET | Freq: Every day | ORAL | 0 refills | Status: DC
Start: 2023-03-01 — End: 2024-04-03

## 2023-03-01 MED ORDER — BUPROPION HCL ER (XL) 150 MG PO TB24: 150.0000 mg | ORAL_TABLET | Freq: Every day | ORAL | 0 refills | Status: AC

## 2023-03-01 NOTE — Telephone Encounter (Addendum)
Patient aware. Per patient her insurance didn't cover her Blood Pressure medicine and if provider can check with insurance first to see which medicines are covered. Told patient to check with her insurance first and to call us which medications are cover.

## 2023-03-01 NOTE — Telephone Encounter (Signed)
Prior Authorization done. From Cover My meds: Information regarding your request Message from [facility]: Drug is not covered by plan

## 2023-03-02 ENCOUNTER — Ambulatory Visit: Payer: Self-pay

## 2023-03-02 NOTE — Telephone Encounter (Signed)
Spoke to [facility] at [facility] and advised patient should have Rx for Wellbutrin XL and Naltrexone. Prescriptions were originally sent to [facility] but patient wants to pick-up from [facility] instead. Pharmacist stated she would process both orders.   Patient notified.  Summary: weight loss med needed   [facility] calling b/c pt called them and told them she was supposed to have another form of weight loss med in that is a tablet..  pt stated the dr was to call something in after her last appt. but they do not have anything.

## 2023-03-20 ENCOUNTER — Ambulatory Visit: Payer: Self-pay | Admitting: Family Medicine

## 2023-04-17 ENCOUNTER — Other Ambulatory Visit: Payer: Self-pay | Admitting: Family Medicine

## 2023-04-17 DIAGNOSIS — I10 Essential (primary) hypertension: Secondary | ICD-10-CM

## 2023-04-18 NOTE — Telephone Encounter (Signed)
Requested Prescriptions  Pending Prescriptions Disp Refills   pantoprazole (PROTONIX) 40 MG tablet [Pharmacy Med Name: PANTOPRAZOLE SOD DR 40 MG TAB] 30 tablet 0    Sig: TAKE ONE TABLET BY MOUTH ONE TIME DAILY     Gastroenterology: Proton Pump Inhibitors Passed - 04/17/2023  7:37 AM      Passed - Valid encounter within last 12 months    Recent Outpatient Visits           1 month ago Morbid obesity [facility])   [place] [facility] Merita Norton T, FNP               losartan-hydrochlorothiazide (HYZAAR) 50-12.5 MG tablet [Pharmacy Med Name: LOSARTAN-HCTZ 50-12.5 MG TAB[**]] 30 tablet 0    Sig: TAKE ONE TABLET BY MOUTH ONE TIME DAILY     Cardiovascular: ARB + Diuretic Combos Failed - 04/17/2023  7:37 AM      Failed - Last BP in normal range    BP Readings from Last 1 Encounters:  02/20/23 (!) 149/82         Passed - K in normal range and within 180 days    Potassium  Date Value Ref Range Status  02/20/2023 4.3 3.5 - 5.2 mmol/L Final         Passed - Na in normal range and within 180 days    Sodium  Date Value Ref Range Status  02/20/2023 139 134 - 144 mmol/L Final         Passed - Cr in normal range and within 180 days    Creatinine, Ser  Date Value Ref Range Status  02/20/2023 0.68 0.57 - 1.00 mg/dL Final         Passed - eGFR is 10 or above and within 180 days    GFR calc Af Amer  Date Value Ref Range Status  03/03/2015 >60 >60 mL/min Final    Comment:    (NOTE) The eGFR has been calculated using the CKD EPI equation. This calculation has not been validated in all clinical situations. eGFR's persistently <60 mL/min signify possible Chronic Kidney Disease.    GFR, Estimated  Date Value Ref Range Status  10/10/2022 >60 >60 mL/min Final    Comment:    (NOTE) Calculated using the CKD-EPI Creatinine Equation (2021)    eGFR  Date Value Ref Range Status  02/20/2023 116 >59 mL/min/1.73 Final         Passed - Patient is not pregnant       Passed - Valid encounter within last 6 months    Recent Outpatient Visits           1 month ago Morbid obesity [facility])   [facility] Jacky Kindle, [place]

## 2023-07-30 ENCOUNTER — Encounter: Payer: Self-pay | Admitting: Certified Nurse Midwife

## 2023-07-30 ENCOUNTER — Other Ambulatory Visit: Payer: Self-pay

## 2023-07-30 MED ORDER — NORETHINDRONE ACETATE 5 MG PO TABS: 5.0000 mg | ORAL_TABLET | Freq: Every day | ORAL | 0 refills | Status: AC

## 2024-01-11 ENCOUNTER — Telehealth: Payer: Self-pay | Admitting: Family Medicine

## 2024-01-11 ENCOUNTER — Other Ambulatory Visit: Payer: Self-pay

## 2024-01-11 NOTE — Telephone Encounter (Signed)
 [facility] faxed refill request for the following medications:   pantoprazole  (PROTONIX ) 40 MG tablet    Please advise.

## 2024-01-14 ENCOUNTER — Other Ambulatory Visit: Payer: Self-pay

## 2024-01-14 NOTE — Telephone Encounter (Signed)
Converted to refill request 

## 2024-02-23 ENCOUNTER — Other Ambulatory Visit: Payer: Self-pay | Admitting: Certified Nurse Midwife

## 2024-04-03 ENCOUNTER — Other Ambulatory Visit: Payer: Self-pay

## 2024-04-03 ENCOUNTER — Emergency Department
Admission: EM | Admit: 2024-04-03 | Discharge: 2024-04-03 | Disposition: A | Discharge status: Home / Self Care | Attending: Emergency Medicine | Admitting: Emergency Medicine

## 2024-04-03 ENCOUNTER — Emergency Department

## 2024-04-03 DIAGNOSIS — R197 Diarrhea, unspecified: Secondary | ICD-10-CM | POA: Diagnosis present

## 2024-04-03 DIAGNOSIS — K529 Noninfective gastroenteritis and colitis, unspecified: Secondary | ICD-10-CM | POA: Insufficient documentation

## 2024-04-03 DIAGNOSIS — I10 Essential (primary) hypertension: Secondary | ICD-10-CM | POA: Diagnosis not present

## 2024-04-03 LAB — COMPREHENSIVE METABOLIC PANEL WITH GFR
ALT: 24 U/L (ref 0–44)
AST: 23 U/L (ref 15–41)
Albumin: 4.1 g/dL (ref 3.5–5.0)
Alkaline Phosphatase: 58 U/L (ref 38–126)
Anion gap: 13 (ref 5–15)
BUN: 9 mg/dL (ref 6–20)
CO2: 24 mmol/L (ref 22–32)
Calcium: 9.3 mg/dL (ref 8.9–10.3)
Chloride: 99 mmol/L (ref 98–111)
Creatinine, Ser: 0.7 mg/dL (ref 0.44–1.00)
GFR, Estimated: >60 mL/min (ref >60)
Glucose, Bld: 100 mg/dL — ABNORMAL HIGH (ref 70–99)
Potassium: 3.8 mmol/L (ref 3.5–5.1)
Sodium: 136 mmol/L (ref 135–145)
Total Bilirubin: 0.9 mg/dL (ref 0.0–1.2)
Total Protein: 8.4 g/dL — ABNORMAL HIGH (ref 6.5–8.1)

## 2024-04-03 LAB — POC URINE PREG, ED: Preg Test, Ur: NEGATIVE

## 2024-04-03 LAB — CBC
HCT: 41.3 % (ref 36.0–46.0)
Hemoglobin: 13.4 g/dL (ref 12.0–15.0)
MCH: 28.2 pg (ref 26.0–34.0)
MCHC: 32.4 g/dL (ref 30.0–36.0)
MCV: 86.9 fL (ref 80.0–100.0)
Platelets: 363 K/uL (ref 150–400)
RBC: 4.75 MIL/uL (ref 3.87–5.11)
RDW: 12.9 % (ref 11.5–15.5)
WBC: 12.9 K/uL — ABNORMAL HIGH (ref 4.0–10.5)
nRBC: 0 % (ref 0.0–0.2)

## 2024-04-03 LAB — URINALYSIS, ROUTINE W REFLEX MICROSCOPIC
Bilirubin Urine: NEGATIVE
Glucose, UA: NEGATIVE mg/dL
Ketones, ur: NEGATIVE mg/dL
Leukocytes,Ua: NEGATIVE
Nitrite: NEGATIVE
Protein, ur: NEGATIVE mg/dL
Specific Gravity, Urine: 1.025 (ref 1.005–1.030)
pH: 5 (ref 5.0–8.0)

## 2024-04-03 LAB — LIPASE, BLOOD: Lipase: 30 U/L (ref 11–51)

## 2024-04-03 MED ORDER — OXYCODONE-ACETAMINOPHEN 5-325 MG PO TABS: 1.0000 | ORAL_TABLET | Freq: Four times a day (QID) | ORAL | 0 refills | Status: AC | PRN

## 2024-04-03 MED ORDER — SODIUM CHLORIDE 0.9 % IV BOLUS
1000.0000 mL | Freq: Once | INTRAVENOUS | Status: AC
  Administered 2024-04-03: 1000 mL via INTRAVENOUS

## 2024-04-03 MED ORDER — MORPHINE SULFATE (PF) 4 MG/ML IV SOLN
4.0000 mg | Freq: Once | INTRAVENOUS | Status: AC
  Administered 2024-04-03: 4 mg via INTRAVENOUS
  Filled 2024-04-03: qty 1

## 2024-04-03 MED ORDER — ONDANSETRON HCL 4 MG/2ML IJ SOLN
4.0000 mg | Freq: Once | INTRAMUSCULAR | Status: AC
  Administered 2024-04-03: 4 mg via INTRAVENOUS
  Filled 2024-04-03: qty 2

## 2024-04-03 MED ORDER — CIPROFLOXACIN HCL 500 MG PO TABS: 500.0000 mg | ORAL_TABLET | Freq: Two times a day (BID) | ORAL | 0 refills | Status: AC

## 2024-04-03 MED ORDER — IOHEXOL 300 MG/ML  SOLN
100.0000 mL | Freq: Once | INTRAMUSCULAR | Status: AC | PRN
  Administered 2024-04-03: 100 mL via INTRAVENOUS

## 2024-04-03 MED ORDER — METRONIDAZOLE 500 MG PO TABS: 500.0000 mg | ORAL_TABLET | Freq: Three times a day (TID) | ORAL | 0 refills | Status: AC

## 2024-04-03 NOTE — Discharge Instructions (Signed)
 Please call and schedule follow-up appointment with the gastroenterologist.  Take the antibiotics as prescribed and until finished.  While taking the pain medication you should not drive or operate any machinery.  Be aware that the medication may make you sleepy, dizzy, or off balance.  Change positions slowly.  Also, be aware that pain medications may lead to constipation.  If you feel that this is beginning, you should take stool softener such as Colace.  Return to the emergency department if you develop a fever or if your pain worsens.

## 2024-04-03 NOTE — ED Notes (Signed)
 Asked others in dept to assist when available for IV

## 2024-04-03 NOTE — ED Provider Notes (Signed)
 [facility] Provider Note    Event Date/Time   First MD Initiated Contact with Patient 04/03/24 1156     (approximate)   History   Abdominal Pain   HPI  Merve Hotard is a 38 y.o. female with history of endometriosis, hypertension and as listed in EMR presents to the emergency department for treatment and evaluation of cramping abdominal pain with nausea, vomiting, and diarrhea.  Symptoms started 3 days ago.  No known fever.   Physical Exam    Vitals:   04/03/24 1220 04/03/24 1239  BP:  (!) 154/79  Pulse:  100  Resp:  20  Temp:  98.3 F (36.8 C)  SpO2: 97% 97%    General: Awake, no distress.  CV:  Good peripheral perfusion.  Resp:  Normal effort.  Abd:  No distention. Soft. Bowel sounds active x 4 quadrants. No focal tenderness or rebound. Other:     ED Results / Procedures / Treatments   Labs (all labs ordered are listed, but only abnormal results are displayed)  Labs Reviewed  COMPREHENSIVE METABOLIC PANEL WITH GFR - Abnormal; Notable for the following components:      Result Value   Glucose, Bld 100 (*)    Total Protein 8.4 (*)    All other components within normal limits  CBC - Abnormal; Notable for the following components:   WBC 12.9 (*)    All other components within normal limits  URINALYSIS, ROUTINE W REFLEX MICROSCOPIC - Abnormal; Notable for the following components:   Color, Urine YELLOW (*)    APPearance CLOUDY (*)    Hgb urine dipstick SMALL (*)    Bacteria, UA MANY (*)    All other components within normal limits  LIPASE, BLOOD  POC URINE PREG, ED     EKG  Not indicated.   RADIOLOGY  Image and radiology report reviewed and interpreted by me. Radiology report consistent with the same.  CT abdomen and pelvis with contrast shows enteritis involving the distal and terminal ileum.  No bowel obstruction.  Normal appendix.  Enlarged fatty liver.  PROCEDURES:  Critical Care performed:  No  Procedures   MEDICATIONS ORDERED IN ED:  Medications  sodium chloride 0.9 % bolus 1,000 mL (0 mLs Intravenous Stopped 04/03/24 1614)  ondansetron  (ZOFRAN ) injection 4 mg (4 mg Intravenous Given 04/03/24 1553)  iohexol  (OMNIPAQUE ) 300 MG/ML solution 100 mL (100 mLs Intravenous Contrast Given 04/03/24 1500)  morphine (PF) 4 MG/ML injection 4 mg (4 mg Intravenous Given 04/03/24 1553)     IMPRESSION / MDM / ASSESSMENT AND PLAN / ED COURSE   I have reviewed the triage note and vital signs. Vital signs are stable   Differential diagnosis includes, but is not limited to, gastroenteritis, colitis, intra-abdominal infection  Patient's presentation is most consistent with acute presentation with potential threat to life or bodily function.  38 year old female presenting to the emergency department for 3 days of abdominal pain with nausea, vomiting, and diarrhea.  She denies noting any blood in stool.  See HPI for further details.  On exam, abdomen is soft and without focal area of tenderness.  Labs show a CBC with leukocytosis of 12.9, CMP overall reassuring, lipase is normal, urinalysis shows a small amount of hemoglobin with 6-10 white blood cells, many bacteria but contaminated with squamous epithelial cells.  Patient denies any urinary symptoms.  CT shows enteritis infectious versus inflammatory.  With 3 days of pain even after BRAT diet and mild leukocytosis, plan will  be to treat with ciprofloxacin and Flagyl.  She is aware and agreeable to this plan.  She was also encouraged to see the GI specialist and information provided on discharge paperwork.  She was encouraged to return to the emergency department if her symptoms change or worsen.  Questions answered and patient discharged in stable condition.    FINAL CLINICAL IMPRESSION(S) / ED DIAGNOSES   Final diagnoses:  Enteritis     Rx / DC Orders   ED Discharge Orders          Ordered    ciprofloxacin (CIPRO) 500 MG tablet   2 times daily        04/03/24 1600    metroNIDAZOLE (FLAGYL) 500 MG tablet  3 times daily        04/03/24 1600    oxyCODONE -acetaminophen  (PERCOCET) 5-325 MG tablet  Every 6 hours PRN        04/03/24 1600             Note:  This document was prepared using Dragon voice recognition software and may include unintentional dictation errors.   Herlinda Kirk NOVAK, FNP 04/03/24 1704    Waymond Lorelle Cummins, MD 04/03/24 [phone]

## 2024-04-03 NOTE — ED Triage Notes (Signed)
 Pt arrives via POV. PT c/o abdominal pain, nausea, diarrhea for the  past 3 days. Reports 1 episode of vomiting. PT is AxOx4.
